# Patient Record
Sex: Female | Born: 1985 | Race: Black or African American | Hispanic: No | Marital: Single | Smoking: Current every day smoker
Health system: Southern US, Community
[De-identification: ages and names within clinical notes are randomized; demographics above are authoritative.]

## PROBLEM LIST (undated history)

## (undated) DIAGNOSIS — L509 Urticaria, unspecified: Secondary | ICD-10-CM

## (undated) DIAGNOSIS — G43909 Migraine, unspecified, not intractable, without status migrainosus: Secondary | ICD-10-CM

## (undated) HISTORY — PX: DENTAL SURGERY: SHX609

## (undated) HISTORY — DX: Urticaria, unspecified: L50.9

---
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
## 2003-09-19 ENCOUNTER — Emergency Department (HOSPITAL_COMMUNITY): Admission: EM | Admit: 2003-09-19 | Discharge: 2003-09-19 | Payer: Self-pay | Admitting: Emergency Medicine

## 2004-06-25 ENCOUNTER — Emergency Department (HOSPITAL_COMMUNITY): Admission: EM | Admit: 2004-06-25 | Discharge: 2004-06-26 | Payer: Self-pay | Admitting: *Deleted

## 2005-06-03 ENCOUNTER — Emergency Department (HOSPITAL_COMMUNITY): Admission: EM | Admit: 2005-06-03 | Discharge: 2005-06-03 | Payer: Self-pay | Admitting: Emergency Medicine

## 2005-06-05 ENCOUNTER — Inpatient Hospital Stay (HOSPITAL_COMMUNITY): Admission: AD | Admit: 2005-06-05 | Discharge: 2005-06-05 | Payer: Self-pay | Admitting: *Deleted

## 2005-06-08 ENCOUNTER — Inpatient Hospital Stay (HOSPITAL_COMMUNITY): Admission: AD | Admit: 2005-06-08 | Discharge: 2005-06-08 | Payer: Self-pay | Admitting: Obstetrics & Gynecology

## 2005-06-16 ENCOUNTER — Inpatient Hospital Stay (HOSPITAL_COMMUNITY): Admission: AD | Admit: 2005-06-16 | Discharge: 2005-06-16 | Payer: Self-pay | Admitting: *Deleted

## 2005-12-01 ENCOUNTER — Emergency Department (HOSPITAL_COMMUNITY): Admission: EM | Admit: 2005-12-01 | Discharge: 2005-12-02 | Payer: Self-pay | Admitting: Emergency Medicine

## 2006-02-05 ENCOUNTER — Inpatient Hospital Stay (HOSPITAL_COMMUNITY): Admission: AD | Admit: 2006-02-05 | Discharge: 2006-02-05 | Payer: Self-pay | Admitting: Gynecology

## 2006-02-06 ENCOUNTER — Inpatient Hospital Stay (HOSPITAL_COMMUNITY): Admission: AD | Admit: 2006-02-06 | Discharge: 2006-02-06 | Payer: Self-pay | Admitting: Gynecology

## 2006-08-09 ENCOUNTER — Emergency Department (HOSPITAL_COMMUNITY): Admission: EM | Admit: 2006-08-09 | Discharge: 2006-08-09 | Payer: Self-pay | Admitting: *Deleted

## 2006-08-17 ENCOUNTER — Emergency Department (HOSPITAL_COMMUNITY): Admission: EM | Admit: 2006-08-17 | Discharge: 2006-08-18 | Payer: Self-pay | Admitting: Emergency Medicine

## 2006-10-05 ENCOUNTER — Ambulatory Visit (HOSPITAL_COMMUNITY): Admission: RE | Admit: 2006-10-05 | Discharge: 2006-10-05 | Payer: Self-pay | Admitting: Family Medicine

## 2006-10-13 ENCOUNTER — Ambulatory Visit (HOSPITAL_COMMUNITY): Admission: RE | Admit: 2006-10-13 | Discharge: 2006-10-13 | Payer: Self-pay | Admitting: Family Medicine

## 2006-11-03 ENCOUNTER — Ambulatory Visit (HOSPITAL_COMMUNITY): Admission: RE | Admit: 2006-11-03 | Discharge: 2006-11-03 | Payer: Self-pay | Admitting: Family Medicine

## 2006-11-10 ENCOUNTER — Inpatient Hospital Stay (HOSPITAL_COMMUNITY): Admission: AD | Admit: 2006-11-10 | Discharge: 2006-11-10 | Payer: Self-pay | Admitting: Obstetrics & Gynecology

## 2006-11-17 ENCOUNTER — Ambulatory Visit (HOSPITAL_COMMUNITY): Admission: RE | Admit: 2006-11-17 | Discharge: 2006-11-17 | Payer: Self-pay | Admitting: Family Medicine

## 2007-03-21 ENCOUNTER — Inpatient Hospital Stay (HOSPITAL_COMMUNITY): Admission: AD | Admit: 2007-03-21 | Discharge: 2007-03-21 | Payer: Self-pay | Admitting: Obstetrics & Gynecology

## 2007-03-21 ENCOUNTER — Ambulatory Visit: Payer: Self-pay | Admitting: Obstetrics & Gynecology

## 2007-04-18 ENCOUNTER — Ambulatory Visit: Payer: Self-pay | Admitting: Obstetrics and Gynecology

## 2007-04-18 ENCOUNTER — Inpatient Hospital Stay (HOSPITAL_COMMUNITY): Admission: AD | Admit: 2007-04-18 | Discharge: 2007-04-21 | Payer: Self-pay | Admitting: Obstetrics and Gynecology

## 2008-03-10 IMAGING — US US OB DETAIL+14 WK
1 series · 14 of 28 positions shown · non-contrast
Comparison: none

OBSTETRICAL ULTRASOUND:
 This ultrasound was performed in The [HOSPITAL], and the [facility] report will be stored to [REDACTED] PACS.

[Series 1: us ob detail+14 wk · 14 of 67 slices shown]
[im 3/67]
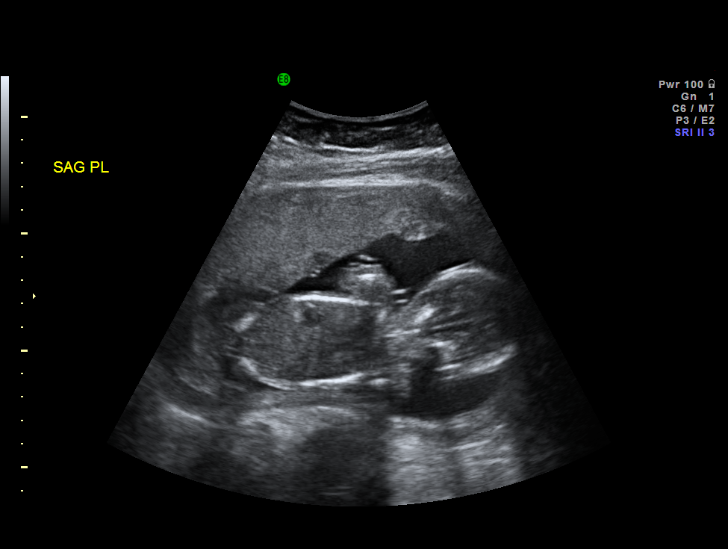
[im 8/67]
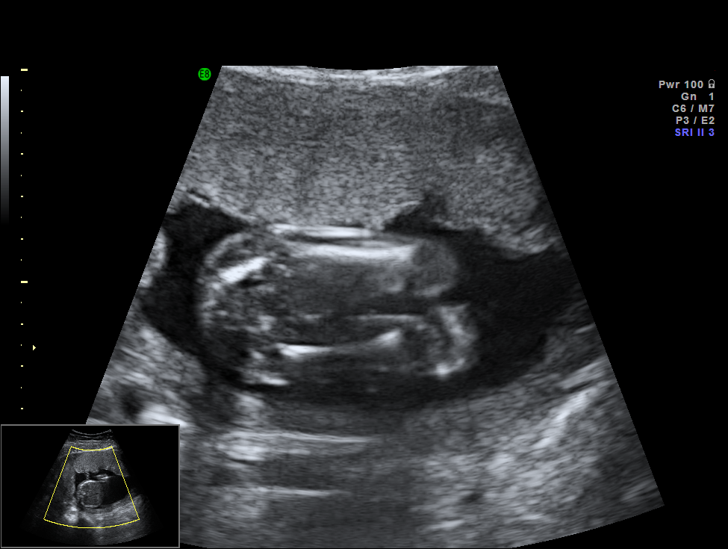
[im 13/67]
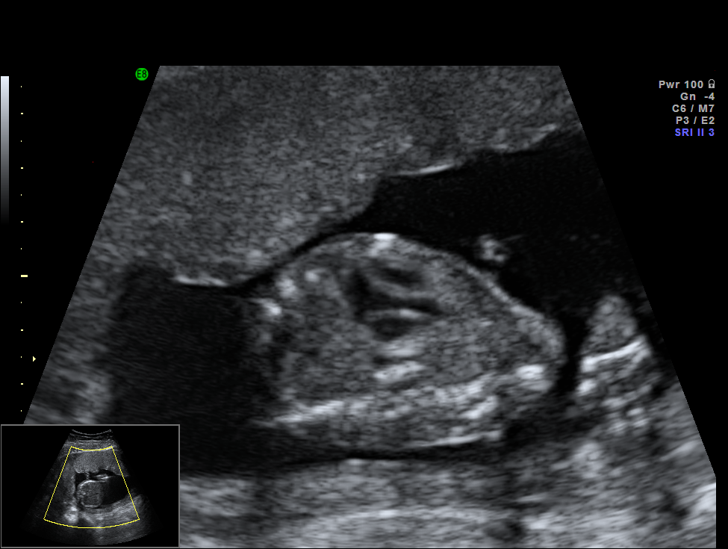
[im 18/67]
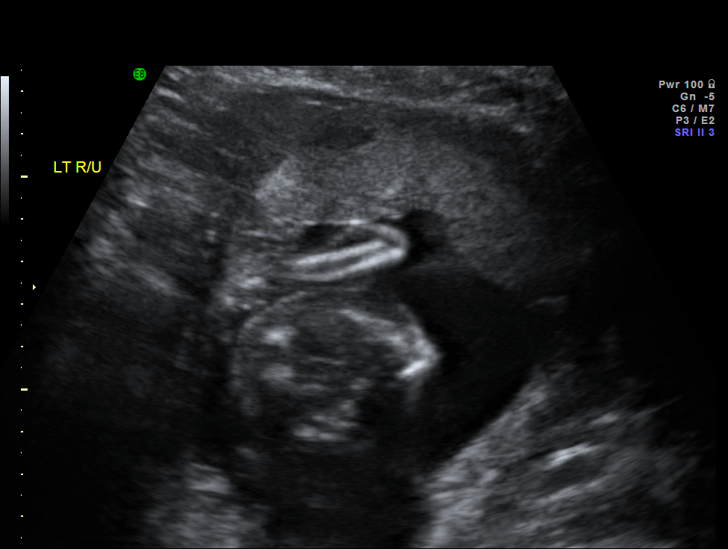
[im 23/67]
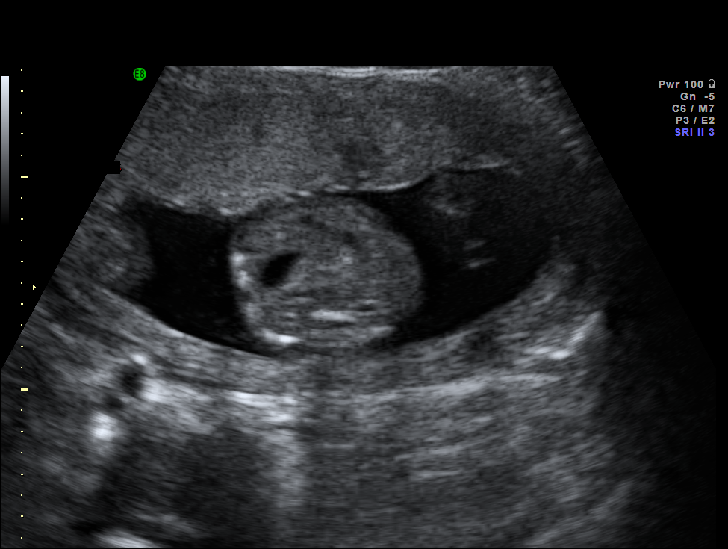
[im 27/67]
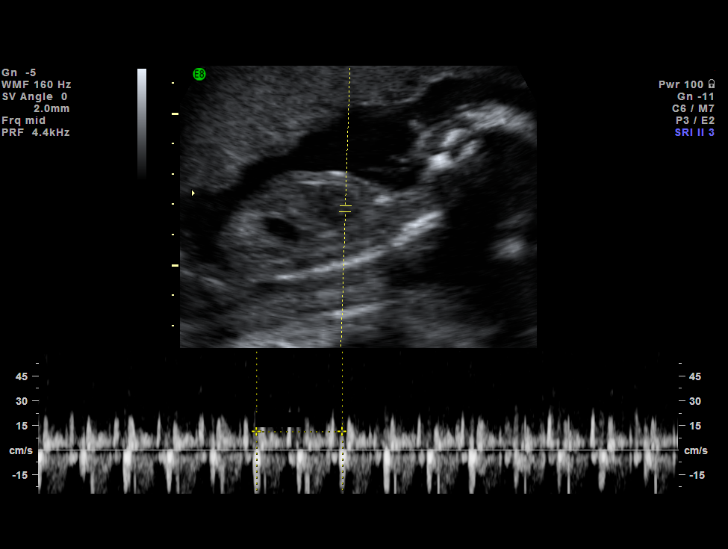
[im 32/67]
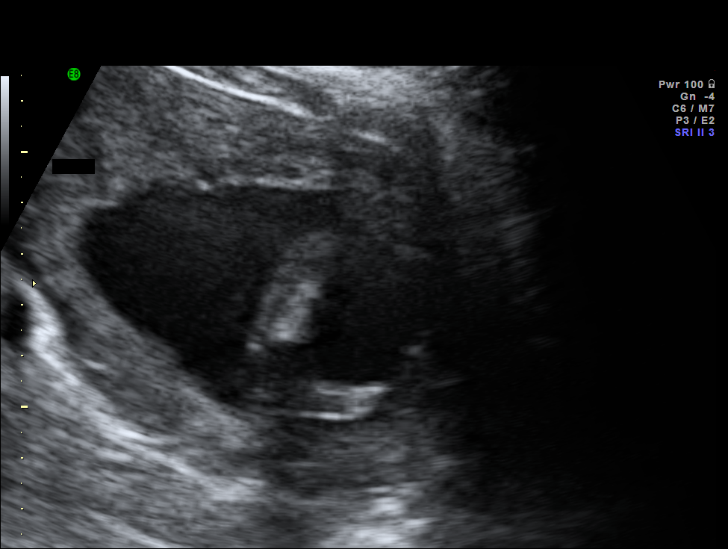
[im 37/67]
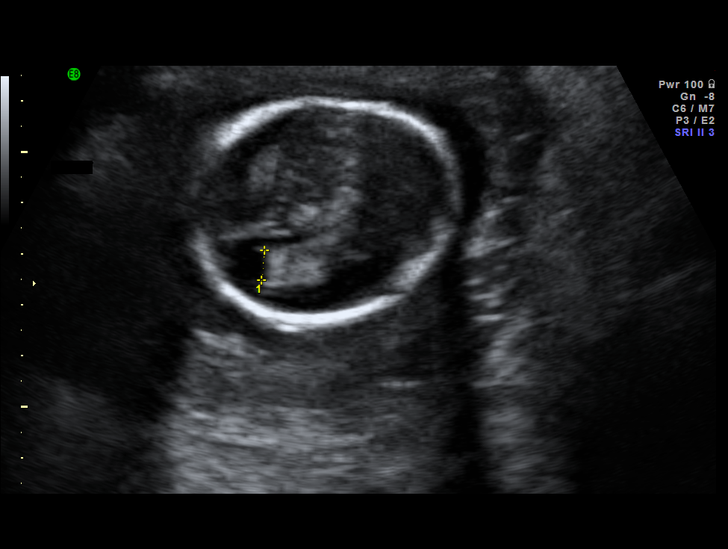
[im 42/67]
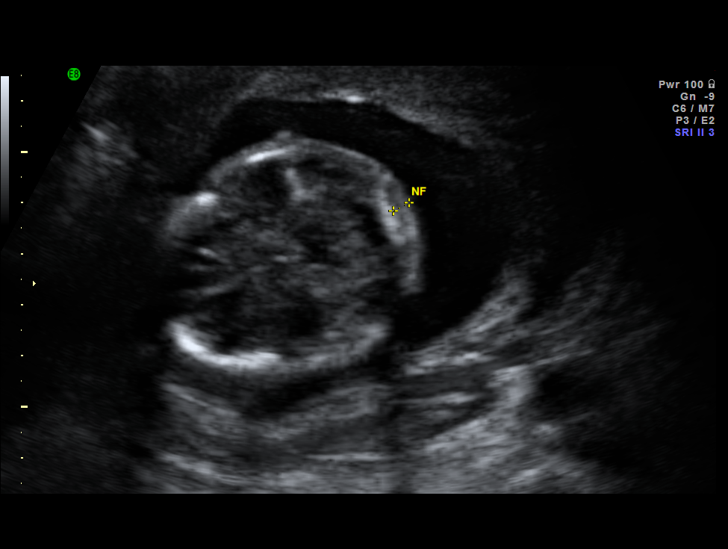
[im 47/67]
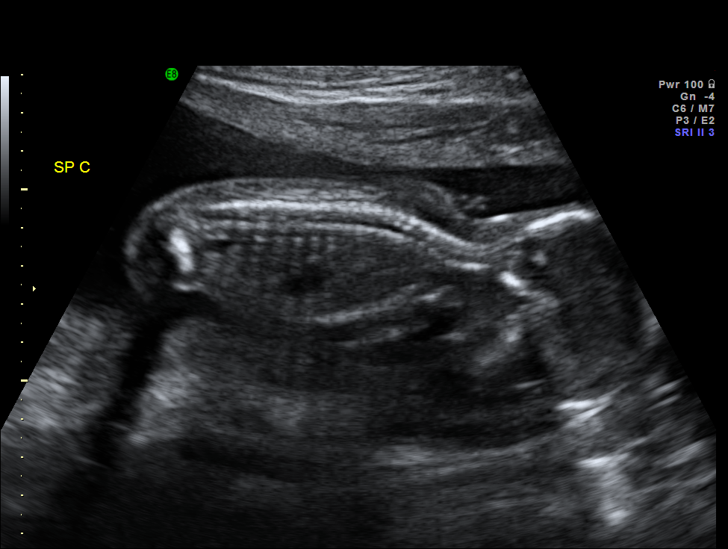
[im 52/67]
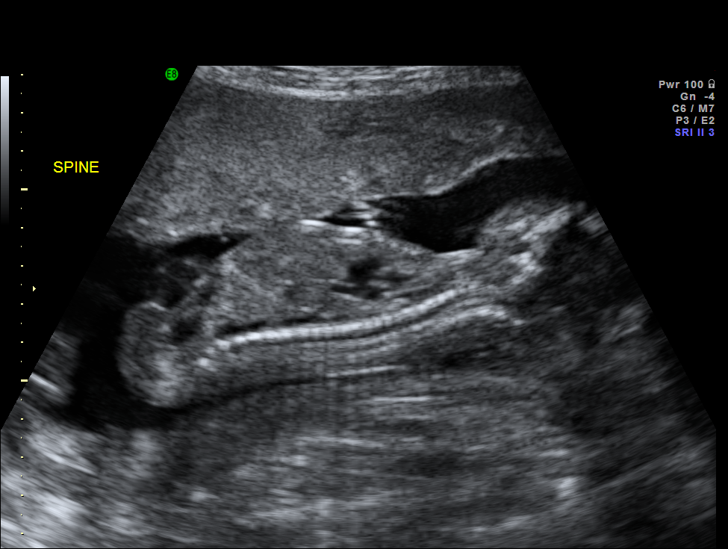
[im 57/67]
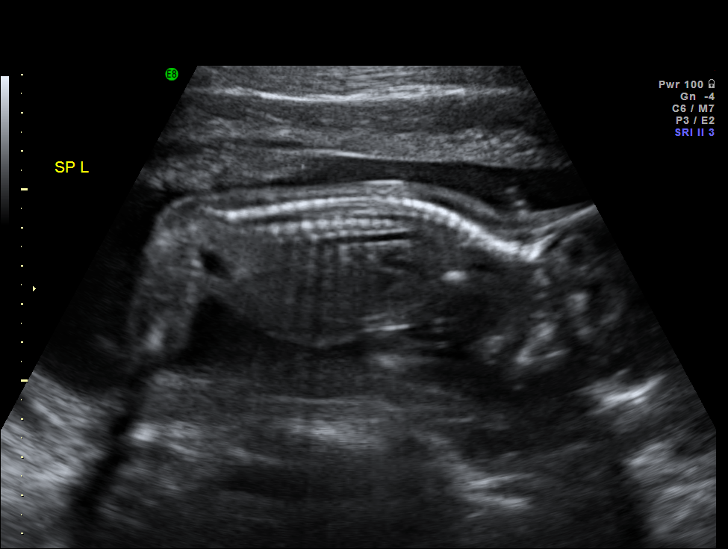
[im 62/67]
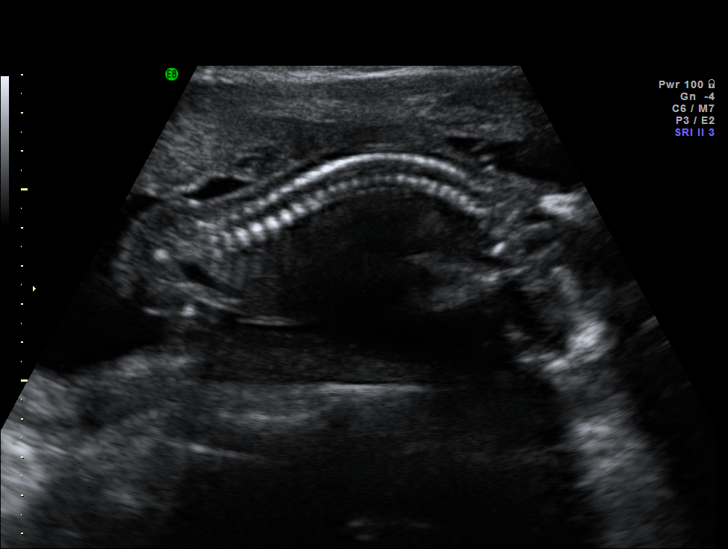
[im 67/67]
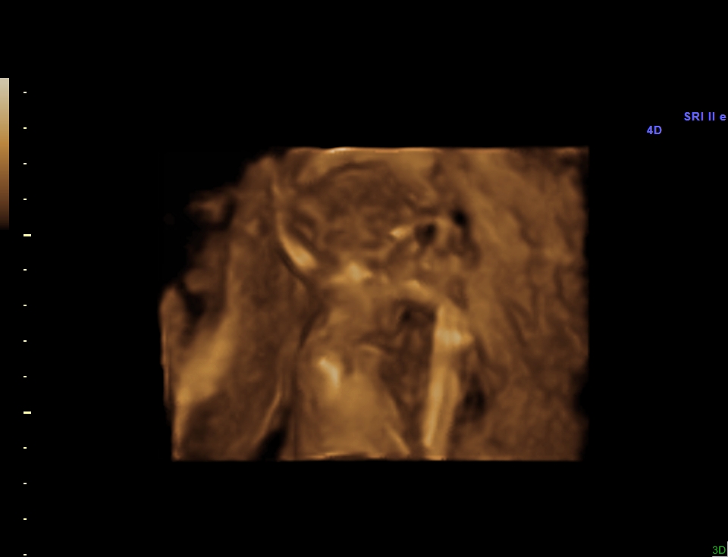

[14 of 28 positions shown; findings below may reference images not displayed]

IMPRESSION: The [facility] report has also been faxed to the ordering physician.

## 2010-03-15 ENCOUNTER — Emergency Department (HOSPITAL_COMMUNITY)
Admission: EM | Admit: 2010-03-15 | Discharge: 2010-03-15 | Payer: Self-pay | Source: Home / Self Care | Admitting: Emergency Medicine

## 2010-09-30 NOTE — Op Note (Signed)
NAME:  [name], Jasmine Cunningham            ACCOUNT NO.:  [id number]   MEDICAL RECORD NO.:  [id number]          PATIENT TYPE:  INP   LOCATION:  9115                          FACILITY:  [facility]   PHYSICIAN:  Phil D. Okey Dupre, M.D.     DATE OF BIRTH:  09-18-1985   DATE OF PROCEDURE:  04/19/2007  DATE OF DISCHARGE:                               OPERATIVE REPORT   DELIVERY NOTE:   PROCEDURE:  Vacuum-assisted vaginal delivery.   OBSTETRICIAN/SURGEON:  Javier Glazier. Okey Dupre, M.D.   ASSISTANT:  Zerita Boers, N.M.   ESTIMATED BLOOD LOSS:  600 mL   ANESTHESIA:  Epidural plus local.   PREOPERATIVE DIAGNOSES:  1. Term pregnancy.  2. Full dilatation with vertex at plus 2 station.  3. Maternal exhaustion.   POSTOPERATIVE DIAGNOSES:  1. Term pregnancy.  2. Full dilatation with vertex at plus 2 station.  3. Maternal exhaustion.   PROCEDURE WENT AS FOLLOWS:  Under satisfactory epidural anesthesia, the  patient having had an extended second stage of labor and was completely  exhausted with 2 plus caput and the vertex in an LOA presentation at a  plus 2 station with adequate room in the pelvis, it was decided to apply  the vacuum for assisted delivery.  The perineum was injected with 20 mL  more of 1% Xylocaine for further relaxation and comfort and the vacuum-  assisted extractor was placed over the occiput.  When the patient  pushed, the vacuum was pulled during three contractions for a total of 3  pulls.  It did pop off twice during that time and by this time, the  vertex was right at the introitus.  The patient was able to push out the  head with fundal pressure over a midline episiotomy.  Once the head was  out, the baby was easily delivered, cord clamped and divided and the  baby taken to the isolette.  Samples of blood were taken from the cord  for analysis.  The placenta was spontaneously delivered.  The vagina was  explored  for lacerations.  None were noted.  The episiotomy was closed in the  usual  manner with 2-0 chromic catgut suture.  The estimated blood loss  was 600 mL.   POSTOPERATIVE CONDITION:  Satisfactory.      Phil D. Okey Dupre, M.D.  Electronically Signed     PDR/MEDQ  D:  04/19/2007  T:  04/19/2007  Job:  [id number]

## 2011-02-23 LAB — CBC
HCT: 23 — ABNORMAL LOW
HCT: 29 — ABNORMAL LOW
Hemoglobin: 10.2 — ABNORMAL LOW
Hemoglobin: 8 — ABNORMAL LOW
MCHC: 33.7
MCHC: 35.1
MCV: 88.9
MCV: 89.6
Platelets: 169
Platelets: 210
RBC: 2.52 — ABNORMAL LOW
RBC: 3.23 — ABNORMAL LOW
RDW: 17.6 — ABNORMAL HIGH
WBC: 7.2
WBC: 8
WBC: 8.3

## 2011-02-23 LAB — COMPREHENSIVE METABOLIC PANEL
ALT: 16
Alkaline Phosphatase: 220 — ABNORMAL HIGH
BUN: 2 — ABNORMAL LOW
CO2: 19
Calcium: 8.6
GFR calc non Af Amer: >60
Glucose, Bld: 80
Sodium: 135
Total Protein: 5.9 — ABNORMAL LOW

## 2011-02-23 LAB — LACTATE DEHYDROGENASE: LDH: 195

## 2011-02-23 LAB — RPR: RPR Ser Ql: NONREACTIVE

## 2011-02-24 LAB — URINALYSIS, ROUTINE W REFLEX MICROSCOPIC
Glucose, UA: NEGATIVE
Hgb urine dipstick: NEGATIVE
Specific Gravity, Urine: >1.03 — ABNORMAL HIGH
pH: 6

## 2011-03-04 LAB — WET PREP, GENITAL
Clue Cells Wet Prep HPF POC: NONE SEEN
Trich, Wet Prep: NONE SEEN
Yeast Wet Prep HPF POC: NONE SEEN

## 2011-03-04 LAB — CBC
HCT: 31.6 — ABNORMAL LOW
Platelets: 221
RDW: 14.7 — ABNORMAL HIGH

## 2011-03-04 LAB — URINALYSIS, ROUTINE W REFLEX MICROSCOPIC
Glucose, UA: NEGATIVE
Protein, ur: NEGATIVE
Specific Gravity, Urine: 1.025
pH: 6

## 2012-06-20 ENCOUNTER — Other Ambulatory Visit: Payer: Self-pay

## 2012-06-20 ENCOUNTER — Encounter (HOSPITAL_COMMUNITY): Payer: Self-pay | Admitting: Emergency Medicine

## 2012-06-20 ENCOUNTER — Emergency Department (HOSPITAL_COMMUNITY)
Admission: EM | Admit: 2012-06-20 | Discharge: 2012-06-20 | Disposition: A | Discharge status: Home / Self Care | Payer: Self-pay | Attending: Emergency Medicine | Admitting: Emergency Medicine

## 2012-06-20 DIAGNOSIS — R42 Dizziness and giddiness: Secondary | ICD-10-CM | POA: Insufficient documentation

## 2012-06-20 DIAGNOSIS — I951 Orthostatic hypotension: Secondary | ICD-10-CM

## 2012-06-20 LAB — CBC WITH DIFFERENTIAL/PLATELET
Basophils Absolute: 0 10*3/uL (ref 0.0–0.1)
Basophils Relative: 0 % (ref 0–1)
Hemoglobin: 10.5 g/dL — ABNORMAL LOW (ref 12.0–15.0)
MCHC: 32.7 g/dL (ref 30.0–36.0)
Monocytes Relative: 8 % (ref 3–12)
Neutro Abs: 2.3 10*3/uL (ref 1.7–7.7)
Neutrophils Relative %: 62 % (ref 43–77)

## 2012-06-20 LAB — BASIC METABOLIC PANEL
Chloride: 104 mEq/L (ref 96–112)
GFR calc Af Amer: >90 mL/min (ref >90)
Potassium: 3.8 mEq/L (ref 3.5–5.1)

## 2012-06-20 MED ORDER — SODIUM CHLORIDE 0.9 % IV SOLN
1000.0000 mL | INTRAVENOUS | Status: DC
  Administered 2012-06-20: 1000 mL via INTRAVENOUS

## 2012-06-20 MED ORDER — SODIUM CHLORIDE 0.9 % IV SOLN
1000.0000 mL | Freq: Once | INTRAVENOUS | Status: AC
  Administered 2012-06-20: 1000 mL via INTRAVENOUS

## 2012-06-20 NOTE — ED Notes (Signed)
Pt c/o syncope and feeling lightheaded x months and with syncopal episode on Saturday where fell and hit head; pt sts dizziness and feeling flushed prior to events; pt denies CP or SOB

## 2012-06-20 NOTE — ED Provider Notes (Signed)
History     CSN: [id number]  Arrival date & time 06/20/12  1043   First MD Initiated Contact with Patient 06/20/12 1113      Chief Complaint  Patient presents with  . Loss of Consciousness     The history is provided by the patient.   patient reports several months of ongoing lightheadedness when she stands up or is on her feet for too long.  She denies heavy menstrual periods.  She denies melena and hematochezia.  She does not have a primary care physician and she's never been seen or evaluated for this complaint.  She finally presents the emergency department today because 2 days ago she stood up became lightheaded Passed out.  She denied receiving chest pain or palpitations.  She has no chest pain or chest tightness at this time.  No prior history of cardiac disease.  She continues to use marijuana but uses no other drugs.  She does not smoke cigarettes.  No family history of early heart disease.  She does report polyuria and polydipsia for several months.  She does not know she's a diabetic.  No fevers or chills.  No other complaints.  History reviewed. No pertinent past medical history.  History reviewed. No pertinent past surgical history.  History reviewed. No pertinent family history.  History  Substance Use Topics  . Smoking status: Never Smoker   . Smokeless tobacco: Not on file  . Alcohol Use: Yes    OB History    Grav Para Term Preterm Abortions TAB SAB Ect Mult Living                  Review of Systems  All other systems reviewed and are negative.    Allergies  Review of patient's allergies indicates no known allergies.  Home Medications  No current outpatient prescriptions on file.  BP 136/87  Pulse 97  Temp 98.6 F (37 C) (Oral)  Resp 18  SpO2 99%  Physical Exam  Nursing note and vitals reviewed. Constitutional: She is oriented to person, place, and time. She appears well-developed and well-nourished. No distress.  HENT:  Head: Normocephalic  and atraumatic.  Eyes: EOM are normal.  Neck: Normal range of motion.  Cardiovascular: Normal rate, regular rhythm and normal heart sounds.   Pulmonary/Chest: Effort normal and breath sounds normal.  Abdominal: Soft. She exhibits no distension. There is no tenderness.  Musculoskeletal: Normal range of motion.  Neurological: She is alert and oriented to person, place, and time.  Skin: Skin is warm and dry.  Psychiatric: She has a normal mood and affect. Judgment normal.    ED Course  Procedures (including critical care time)  Labs Reviewed  CBC WITH DIFFERENTIAL - Abnormal; Notable for the following:    WBC 3.7 (*)     RBC 3.59 (*)     Hemoglobin 10.5 (*)     HCT 32.1 (*)     All other components within normal limits  BASIC METABOLIC PANEL  HCG, SERUM, QUALITATIVE  GLUCOSE, CAPILLARY   No results found.   Date: 06/20/2012  Rate: 90  Rhythm: normal sinus rhythm  QRS Axis: normal  Intervals: normal  ST/T Wave abnormalities: inferior lateral T wave inversions  Conduction Disutrbances: none  Narrative Interpretation:   Old EKG Reviewed: no prior ecg     1. Orthostasis       MDM  Patient feels somewhat better after IV fluids.  EKG normal sinus rhythm.  Outpatient followup with her primary  care physician.  Anemia is baseline for patient.        Lyanne Co, MD 06/20/12 1415

## 2012-06-20 NOTE — ED Notes (Signed)
Pt's CBG is 84. RN notified.

## 2012-06-20 NOTE — ED Notes (Signed)
Pt states she is still feeling dizzy, not feeling any better.

## 2012-06-20 NOTE — ED Notes (Signed)
Pt ambulated to bathroom with quick, steady gait

## 2012-06-20 NOTE — ED Notes (Signed)
Pt c/o feeling dizzy "for months." Pt states "I blacked all the way out on Saturday and woke up in a parking lot." NAd noted presently. Pt states right side of body is painful from fall. Pt states she did hit her head. Today pt c/o dizziness and 6/10 pain in body. Pt states she is also having headaches for 2 wks. Neuro intact, no deficits noted,

## 2014-10-01 ENCOUNTER — Encounter (HOSPITAL_COMMUNITY): Payer: Self-pay | Admitting: *Deleted

## 2014-10-01 ENCOUNTER — Emergency Department (HOSPITAL_COMMUNITY)
Admission: EM | Admit: 2014-10-01 | Discharge: 2014-10-01 | Disposition: A | Discharge status: Home / Self Care | Payer: Self-pay | Attending: Emergency Medicine | Admitting: Emergency Medicine

## 2014-10-01 DIAGNOSIS — Z720 Tobacco use: Secondary | ICD-10-CM | POA: Insufficient documentation

## 2014-10-01 DIAGNOSIS — G43909 Migraine, unspecified, not intractable, without status migrainosus: Secondary | ICD-10-CM | POA: Insufficient documentation

## 2014-10-01 MED ORDER — TIZANIDINE HCL 4 MG PO CAPS: 4.0000 mg | ORAL_CAPSULE | Freq: Three times a day (TID) | ORAL | Status: DC

## 2014-10-01 MED ORDER — PROCHLORPERAZINE MALEATE 10 MG PO TABS
10.0000 mg | ORAL_TABLET | Freq: Once | ORAL | Status: AC
  Administered 2014-10-01: 10 mg via ORAL
  Filled 2014-10-01: qty 1

## 2014-10-01 MED ORDER — TIZANIDINE HCL 4 MG PO TABS
4.0000 mg | ORAL_TABLET | Freq: Once | ORAL | Status: AC
  Administered 2014-10-01: 4 mg via ORAL
  Filled 2014-10-01: qty 1

## 2014-10-01 MED ORDER — DIPHENHYDRAMINE HCL 25 MG PO CAPS
25.0000 mg | ORAL_CAPSULE | Freq: Once | ORAL | Status: AC
  Administered 2014-10-01: 25 mg via ORAL
  Filled 2014-10-01: qty 1

## 2014-10-01 NOTE — ED Notes (Signed)
Pt c/o migraine x 4 days with blurry vision. Pt states that she has taken alleve with no relief.

## 2014-10-01 NOTE — Discharge Instructions (Signed)
Please follow up with your primary care physician in 1-2 days. If you do not have one please call the [facility] number listed above. Please read all discharge instructions and return precautions.  ° °Migraine Headache °A migraine headache is an intense, throbbing pain on one or both sides of your head. A migraine can last for 30 minutes to several hours. °CAUSES  °The exact cause of a migraine headache is not always known. However, a migraine may be caused when nerves in the brain become irritated and release chemicals that cause inflammation. This causes pain. °Certain things may also trigger migraines, such as: °· Alcohol. °· Smoking. °· Stress. °· Menstruation. °· Aged cheeses. °· Foods or drinks that contain nitrates, glutamate, aspartame, or tyramine. °· Lack of sleep. °· Chocolate. °· Caffeine. °· Hunger. °· Physical exertion. °· Fatigue. °· Medicines used to treat chest pain (nitroglycerine), birth control pills, estrogen, and some blood pressure medicines. °SIGNS AND SYMPTOMS °· Pain on one or both sides of your head. °· Pulsating or throbbing pain. °· Severe pain that prevents daily activities. °· Pain that is aggravated by any physical activity. °· Nausea, vomiting, or both. °· Dizziness. °· Pain with exposure to bright lights, loud noises, or activity. °· General sensitivity to bright lights, loud noises, or smells. °Before you get a migraine, you may get warning signs that a migraine is coming (aura). An aura may include: °· Seeing flashing lights. °· Seeing bright spots, halos, or zigzag lines. °· Having tunnel vision or blurred vision. °· Having feelings of numbness or tingling. °· Having trouble talking. °· Having muscle weakness. °DIAGNOSIS  °A migraine headache is often diagnosed based on: °· Symptoms. °· Physical exam. °· A CT scan or MRI of your head. These imaging tests cannot diagnose migraines, but they can help rule out other causes of headaches. °TREATMENT °Medicines may  be given for pain and nausea. Medicines can also be given to help prevent recurrent migraines.  °HOME CARE INSTRUCTIONS °· Only take over-the-counter or prescription medicines for pain or discomfort as directed by your health care provider. The use of long-term narcotics is not recommended. °· Lie down in a dark, quiet room when you have a migraine. °· Keep a journal to find out what may trigger your migraine headaches. For example, write down: °¨ What you eat and drink. °¨ How much sleep you get. °¨ Any change to your diet or medicines. °· Limit alcohol consumption. °· Quit smoking if you smoke. °· Get 7-9 hours of sleep, or as recommended by your health care provider. °· Limit stress. °· Keep lights dim if bright lights bother you and make your migraines worse. °SEEK IMMEDIATE MEDICAL CARE IF:  °· Your migraine becomes severe. °· You have a fever. °· You have a stiff neck. °· You have vision loss. °· You have muscular weakness or loss of muscle control. °· You start losing your balance or have trouble walking. °· You feel faint or pass out. °· You have severe symptoms that are different from your first symptoms. °MAKE SURE YOU:  °· Understand these instructions. °· Will watch your condition. °· Will get help right away if you are not doing well or get worse. °Document Released: 05/04/2005 Document Revised: 09/18/2013 Document Reviewed: 01/09/2013 °[employer]® Patient Information ©2015 [employer]. This information is not intended to replace advice given to you by your health care provider. Make sure you discuss any questions you have with your health care provider. ° °

## 2014-10-01 NOTE — ED Provider Notes (Signed)
CSN: [id number]     Arrival date & time 10/01/14  1618 History  This chart was scribed for non-physician practitioner, Francee PiccoloJennifer Laterrica Libman working with Raeford RazorStephen Kohut, MD by Richarda Overlieichard Holland, ED Scribe. This patient was seen in room TR09C/TR09C and the patient's care was started at 6:25 PM.  Chief Complaint  Patient presents with  . Headache   The history is provided by the patient. No language interpreter was used.   HPI Comments: Jasmine Cunningham is a 29 y.o. female who presents to the Emergency Department complaining of gradually worsening headache for the last 4 days. Pt reports associated visual blurriness and photophobia. She states that her HA is located on the back side of her head and describes her pain as throbbing. Pt states that she has tried taking aleve without relief. She reports that her last migraine was approximately 3 months ago. Pt denies any recent tick bites. Pt denies vomiting, diarrhea, fever or rash.    History reviewed. No pertinent past medical history. History reviewed. No pertinent past surgical history. No family history on file. History  Substance Use Topics  . Smoking status: Current Every Day Smoker -- 0.50 packs/day    Types: Cigarettes  . Smokeless tobacco: Not on file  . Alcohol Use: Yes     Comment: ocassional   OB History    No data available     Review of Systems  Constitutional: Negative for fever.  Eyes: Positive for visual disturbance.  Gastrointestinal: Negative for vomiting.  Skin: Negative for rash.  Neurological: Positive for headaches.  All other systems reviewed and are negative.  Allergies  Review of patient's allergies indicates no known allergies.  Home Medications   Prior to Admission medications   Medication Sig Start Date End Date Taking? Authorizing Provider  tiZANidine (ZANAFLEX) 4 MG capsule Take 1 capsule (4 mg total) by mouth 3 (three) times daily. 10/01/14   Jordyan Hardiman, PA-C   BP 126/75 mmHg  Pulse 65   Temp(Src) 98 F (36.7 C) (Oral)  Resp 18  Ht 5\' 8"  (1.727 m)  Wt 220 lb 8 oz (100.018 kg)  BMI 33.53 kg/m2  SpO2 100%  LMP 09/17/2014   Physical Exam  Constitutional: She is oriented to person, place, and time. She appears well-developed and well-nourished. No distress.  HENT:  Head: Normocephalic and atraumatic.  Right Ear: External ear normal.  Left Ear: External ear normal.  Nose: Nose normal.  Mouth/Throat: Oropharynx is clear and moist.  Eyes: Conjunctivae are normal.  Neck: Normal range of motion. Neck supple.  No nuchal rigidity.   Cardiovascular: Normal rate.   Pulmonary/Chest: Effort normal.  Abdominal: Soft.  Musculoskeletal: Normal range of motion.  Neurological: She is alert and oriented to person, place, and time.  Skin: Skin is warm and dry. She is not diaphoretic.  Psychiatric: She has a normal mood and affect.  Nursing note and vitals reviewed.   ED Course  Procedures   Medications  tiZANidine (ZANAFLEX) tablet 4 mg (4 mg Oral Given 10/01/14 1850)  diphenhydrAMINE (BENADRYL) capsule 25 mg (25 mg Oral Given 10/01/14 1850)  prochlorperazine (COMPAZINE) tablet 10 mg (10 mg Oral Given 10/01/14 1850)    DIAGNOSTIC STUDIES: Oxygen Saturation is 97% on RA, normal by my interpretation.    COORDINATION OF CARE: 6:30 PM Discussed treatment plan with pt at bedside and pt agreed to plan.   Labs Review Labs Reviewed - No data to display  Imaging Review No results found.   EKG Interpretation None  MDM   Final diagnoses:  Migraine without status migrainosus, not intractable, unspecified migraine type   Filed Vitals:   10/01/14 1856  BP: 126/75  Pulse: 65  Temp: 98 F (36.7 C)  Resp: 18   Afebrile, NAD, non-toxic appearing, AAOx4.  Pt HA treated and improved while in ED.  Presentation is like pts typical HA and non concerning for [facility], ICH, Meningitis, or temporal arteritis. Pt is afebrile with no focal neuro deficits, nuchal rigidity, or change  in vision. Pt is to follow up with PCP to discuss prophylactic medication. Pt verbalizes understanding and is agreeable with plan to dc. Patient is stable at time of discharge    I personally performed the services described in this documentation, which was scribed in my presence. The recorded information has been reviewed and is accurate.       Francee PiccoloJennifer Yolando Gillum, PA-C 10/01/14 2116  Raeford RazorStephen Kohut, MD 10/03/14 [phone]

## 2015-01-28 ENCOUNTER — Emergency Department (HOSPITAL_COMMUNITY)
Admission: EM | Admit: 2015-01-28 | Discharge: 2015-01-28 | Disposition: A | Discharge status: Home / Self Care | Payer: Self-pay | Attending: Emergency Medicine | Admitting: Emergency Medicine

## 2015-01-28 ENCOUNTER — Encounter (HOSPITAL_COMMUNITY): Payer: Self-pay

## 2015-01-28 DIAGNOSIS — G44009 Cluster headache syndrome, unspecified, not intractable: Secondary | ICD-10-CM | POA: Insufficient documentation

## 2015-01-28 DIAGNOSIS — G4489 Other headache syndrome: Secondary | ICD-10-CM

## 2015-01-28 DIAGNOSIS — Z720 Tobacco use: Secondary | ICD-10-CM | POA: Insufficient documentation

## 2015-01-28 DIAGNOSIS — Z79899 Other long term (current) drug therapy: Secondary | ICD-10-CM | POA: Insufficient documentation

## 2015-01-28 DIAGNOSIS — Z8679 Personal history of other diseases of the circulatory system: Secondary | ICD-10-CM | POA: Insufficient documentation

## 2015-01-28 HISTORY — DX: Migraine, unspecified, not intractable, without status migrainosus: G43.909

## 2015-01-28 MED ORDER — DEXAMETHASONE SODIUM PHOSPHATE 10 MG/ML IJ SOLN
10.0000 mg | Freq: Once | INTRAMUSCULAR | Status: AC
  Administered 2015-01-28: 10 mg via INTRAMUSCULAR
  Filled 2015-01-28: qty 1

## 2015-01-28 MED ORDER — BUTALBITAL-APAP-CAFFEINE 50-325-40 MG PO TABS: 1.0000 | ORAL_TABLET | Freq: Four times a day (QID) | ORAL | Status: DC | PRN

## 2015-01-28 MED ORDER — DIPHENHYDRAMINE HCL 50 MG/ML IJ SOLN
25.0000 mg | Freq: Once | INTRAMUSCULAR | Status: AC
  Administered 2015-01-28: 25 mg via INTRAMUSCULAR
  Filled 2015-01-28: qty 1

## 2015-01-28 MED ORDER — METOCLOPRAMIDE HCL 5 MG/ML IJ SOLN
10.0000 mg | Freq: Once | INTRAMUSCULAR | Status: AC
  Administered 2015-01-28: 10 mg via INTRAMUSCULAR
  Filled 2015-01-28: qty 2

## 2015-01-28 MED ORDER — KETOROLAC TROMETHAMINE 60 MG/2ML IM SOLN
60.0000 mg | Freq: Once | INTRAMUSCULAR | Status: AC
  Administered 2015-01-28: 60 mg via INTRAMUSCULAR
  Filled 2015-01-28: qty 2

## 2015-01-28 NOTE — ED Provider Notes (Signed)
CSN: [id number]     Arrival date & time 01/28/15  1248 History   First MD Initiated Contact with Patient 01/28/15 1705     Chief Complaint  Patient presents with  . Migraine     (Consider location/radiation/quality/duration/timing/severity/associated sxs/prior Treatment) HPI Comments: Patient here complaining of bitemporal migraine headache times several months. Symptoms are worse after she uses her computer screen at work. She has nausea but no vomiting. She does have photophobia and also phonophobia. Denies any neck pain. Denies any flashes of lights in her vision. Symptoms are better when she sleeps. Has tried over-the-counter medications without relief. No associated syncope or near-syncope. No focal neurological findings.  Patient is a 29 y.o. female presenting with migraines. The history is provided by the patient and a relative.  Migraine    Past Medical History  Diagnosis Date  . Migraine    Past Surgical History  Procedure Laterality Date  . Dental surgery     Family History  Problem Relation Age of Onset  . Hypertension Father    Social History  Substance Use Topics  . Smoking status: Current Every Day Smoker -- 0.50 packs/day    Types: Cigarettes  . Smokeless tobacco: Never Used  . Alcohol Use: Yes     Comment: ocassional   OB History    No data available     Review of Systems  All other systems reviewed and are negative.     Allergies  Review of patient's allergies indicates no known allergies.  Home Medications   Prior to Admission medications   Medication Sig Start Date End Date Taking? Authorizing Provider  tiZANidine (ZANAFLEX) 4 MG capsule Take 1 capsule (4 mg total) by mouth 3 (three) times daily. 10/01/14   Jennifer Piepenbrink, PA-C   BP 162/99 mmHg  Pulse 86  Temp(Src) 98.3 F (36.8 C) (Oral)  Resp 16  Ht 5\' 8"  (1.727 m)  Wt 220 lb (99.791 kg)  BMI 33.46 kg/m2  SpO2 100%  LMP 01/07/2015 Physical Exam  Constitutional: She is  oriented to person, place, and time. She appears well-developed and well-nourished.  Non-toxic appearance. No distress.  HENT:  Head: Normocephalic and atraumatic.  Eyes: Conjunctivae, EOM and lids are normal. Pupils are equal, round, and reactive to light.  Neck: Normal range of motion. Neck supple. No tracheal deviation present. No thyroid mass present.  Cardiovascular: Normal rate, regular rhythm and normal heart sounds.  Exam reveals no gallop.   No murmur heard. Pulmonary/Chest: Effort normal and breath sounds normal. No stridor. No respiratory distress. She has no decreased breath sounds. She has no wheezes. She has no rhonchi. She has no rales.  Abdominal: Soft. Normal appearance and bowel sounds are normal. She exhibits no distension. There is no tenderness. There is no rebound and no CVA tenderness.  Musculoskeletal: Normal range of motion. She exhibits no edema or tenderness.  Neurological: She is alert and oriented to person, place, and time. She has normal strength. No cranial nerve deficit or sensory deficit. GCS eye subscore is 4. GCS verbal subscore is 5. GCS motor subscore is 6.  Skin: Skin is warm and dry. No abrasion and no rash noted.  Psychiatric: She has a normal mood and affect. Her speech is normal and behavior is normal.  Nursing note and vitals reviewed.   ED Course  Procedures (including critical care time) Labs Review Labs Reviewed - No data to display  Imaging Review No results found. I have personally reviewed and evaluated these images  and lab results as part of my medical decision-making.   EKG Interpretation None      MDM   Final diagnoses:  None    Patient without red flags for subarachnoid hemorrhage or meningitis. Begin referral to neurology on call as well as primary care    Lorre Nick, MD 01/28/15 1724

## 2015-01-28 NOTE — Discharge Instructions (Signed)

## 2015-01-28 NOTE — ED Notes (Signed)
Patient states she has continues headache, but occasionally she gets a migraine. Today, patient c/o blurred vision, sensitivity to light. And a throbbing headache. Patient denies any N/V.

## 2015-01-28 NOTE — ED Notes (Signed)
Pt comes in today with a c/o migraine and blurred vision. Pt has prior hx of.

## 2015-02-12 ENCOUNTER — Encounter: Payer: Self-pay | Admitting: Neurology

## 2015-02-12 ENCOUNTER — Ambulatory Visit (INDEPENDENT_AMBULATORY_CARE_PROVIDER_SITE_OTHER): Payer: Self-pay | Admitting: Neurology

## 2015-02-12 VITALS — BP 125/85 | HR 90 | Ht 68.0 in | Wt 219.6 lb

## 2015-02-12 DIAGNOSIS — G4719 Other hypersomnia: Secondary | ICD-10-CM | POA: Insufficient documentation

## 2015-02-12 DIAGNOSIS — R51 Headache: Secondary | ICD-10-CM

## 2015-02-12 DIAGNOSIS — R0683 Snoring: Secondary | ICD-10-CM | POA: Insufficient documentation

## 2015-02-12 DIAGNOSIS — G43701 Chronic migraine without aura, not intractable, with status migrainosus: Secondary | ICD-10-CM

## 2015-02-12 DIAGNOSIS — H538 Other visual disturbances: Secondary | ICD-10-CM

## 2015-02-12 DIAGNOSIS — R519 Headache, unspecified: Secondary | ICD-10-CM | POA: Insufficient documentation

## 2015-02-12 MED ORDER — NORTRIPTYLINE HCL 10 MG PO CAPS: 20.0000 mg | ORAL_CAPSULE | Freq: Every day | ORAL | Status: AC

## 2015-02-12 NOTE — Progress Notes (Signed)
[facility]    Provider:  Dr Lucia Gaskins Referring Provider: Lorre Nick, MD Primary Care Physician:  No primary care provider on file.  CC:  migraine  HPI:  Jasmine Cunningham is a 29 y.o. female here as a referral from Dr. Freida Busman for migraines. They started in high school and are worsening. She has a constant headache. The headaches are pushing on the temples as well as left left-sided occipital pain.  Headaches are sharp and persistent. Can be throbbing and pounding when severe and then blurry vision occurs. Headaches are daily. 2x a week can be 8-10/10 of pain. She has light sensitivity, sound sensitivity, needs to be in a dark room sitting still. Has nausea, body aches. Headaches are worse in the middle of the day. She wakes up with headache. She snores at night and is excessively tired all day, wants to sleep all day. Working on a computer triggers her headaches. No food triggers. She has aura occasionally, she gets flashing lights. She was given fioricet at the emergency room. 30 headaches a month with more than half are migrainous. Had had a complete dilated fundoscopic exam recently. No other focal neurologic deficits.   Reviewed notes, labs and imaging from outside physicians, which showed:   Personally reviewed EKG tracing, QTc 430  Review of Systems: Patient complains of symptoms per HPI as well as the following symptoms: fatigue, blurred vision, SOB, swellingin legs, increased thirst, aching muscles, headache, numbness, weakness, dizziness, sleepiness, restless legs, decreased energy, disinterest in activities. Pertinent negatives per HPI. All others negative.   Social History   Social History  . Marital Status: Single    Spouse Name: N/A  . Number of Children: 1  . Years of Education: 16   Occupational History  . [employer]     Social History Main Topics  . Smoking status: Current Every Day Smoker -- 0.50 packs/day    Types: Cigarettes  . Smokeless  tobacco: Never Used  . Alcohol Use: Yes     Comment: ocassional  . Drug Use: 2.00 per week    Special: Marijuana     Comment: occasionally  . Sexual Activity: Not on file   Other Topics Concern  . Not on file   Social History Narrative   Lives at home with herself and son, 48 years old (02/12/15)   Caffeine use: 1 cup coffee every day    Family History  Problem Relation Age of Onset  . Hypertension Father   . Heart Problems Paternal Grandmother   . Migraines Neg Hx     Past Medical History  Diagnosis Date  . Migraine     Past Surgical History  Procedure Laterality Date  . Dental surgery      Current Outpatient Prescriptions  Medication Sig Dispense Refill  . nortriptyline (PAMELOR) 10 MG capsule Take 2 capsules (20 mg total) by mouth at bedtime. 60 capsule 6   No current facility-administered medications for this visit.    Allergies as of 02/12/2015  . (No Known Allergies)    Vitals: BP 125/85 mmHg  Pulse 90  Ht  (1.727 m)  Wt 219 lb 9.6 oz (99.61 kg)  BMI 33.40 kg/m2  LMP 01/07/2015 Last Weight:  Wt Readings from Last 1 Encounters:  02/12/15 219 lb 9.6 oz (99.61 kg)   Last Height:   Ht Readings from Last 1 Encounters:  02/12/15  (1.727 m)   Physical exam: Exam: Gen: NAD, conversant, well nourised, obese, well groomed  CV: RRR, no MRG. No Carotid Bruits. No peripheral edema, warm, nontender Eyes: Conjunctivae clear without exudates or hemorrhage  Neuro: Detailed Neurologic Exam  Speech:    Speech is normal; fluent and spontaneous with normal comprehension.  Cognition:    The patient is oriented to person, place, and time;     recent and remote memory intact;     language fluent;     normal attention, concentration,     fund of knowledge Cranial Nerves:    The pupils are equal, round, and reactive to light. The fundi are normal and spontaneous venous pulsations are present. Visual fields are full to finger  confrontation. Extraocular movements are intact. Trigeminal sensation is intact and the muscles of mastication are normal. The face is symmetric. The palate elevates in the midline. Hearing intact. Voice is normal. Shoulder shrug is normal. The tongue has normal motion without fasciculations.   Coordination:    Normal finger to nose and heel to shin. Normal rapid alternating movements.   Gait:    Heel-toe and tandem gait are normal.   Motor Observation:    No asymmetry, no atrophy, and no involuntary movements noted. Tone:    Normal muscle tone.    Posture:    Posture is normal. normal erect    Strength:    Strength is V/V in the upper and lower limbs.      Sensation: intact to LT     Reflex Exam:  DTR's:    Deep tendon reflexes in the upper and lower extremities are normal bilaterally.   Toes:    The toes are downgoing bilaterally.   Clonus:    Clonus is absent.       Assessment/Plan:  29 year old with chronic migraine here for evaluation. Neuro exam is non focal. She also has insomnia.  Nortriptyline  an hour before bed. Can increase to  as needed in 2-3 weeks.May help with insomnia. MRi of the brain and labs today. Sleep eval for morning headaches, obesity, snoring and excessive daytime fatigue  [name], MD  [facility] [address] [place], [place] [address]  Phone [phone] Fax [phone]

## 2015-02-12 NOTE — Patient Instructions (Signed)
Overall you are doing fairly well but I do want to suggest a few things today:   Remember to drink plenty of fluid, eat healthy meals and do not skip any meals. Try to eat protein with a every meal and eat a healthy snack such as fruit or nuts in between meals. Try to keep a regular sleep-wake schedule and try to exercise daily, particularly in the form of walking, 20-30 minutes a day, if you can.   As far as your medications are concerned, I would like to suggest: Nortriptyline  an hour before bed. Can increase to  as needed in 2-3 weeks.  As far as diagnostic testing: labs, mri brain  I would like to see you back in 3 months, sooner if we need to. Please call us with any interim questions, concerns, problems, updates or refill requests.   Please also call us for any test results so we can go over those with you on the phone.  My clinical assistant and will answer any of your questions and relay your messages to me and also relay most of my messages to you.   Our phone number is [phone]. We also have an after hours call service for urgent matters and there is a physician on-call for urgent questions. For any emergencies you know to call 911 or go to the nearest emergency room

## 2015-02-13 ENCOUNTER — Telehealth: Payer: Self-pay | Admitting: *Deleted

## 2015-02-13 LAB — COMPREHENSIVE METABOLIC PANEL
A/G RATIO: 1.4 (ref 1.1–2.5)
ALBUMIN: 4.3 g/dL (ref 3.5–5.5)
ALT: 31 IU/L (ref 0–32)
AST: 26 IU/L (ref 0–40)
Alkaline Phosphatase: 86 IU/L (ref 39–117)
BUN / CREAT RATIO: 10 (ref 8–20)
BUN: 7 mg/dL (ref 6–20)
Bilirubin Total: 0.4 mg/dL (ref 0.0–1.2)
CALCIUM: 9.2 mg/dL (ref 8.7–10.2)
CO2: 23 mmol/L (ref 18–29)
Chloride: 101 mmol/L (ref 97–108)
Creatinine, Ser: 0.69 mg/dL (ref 0.57–1.00)
GFR, EST AFRICAN AMERICAN: 134 mL/min/{1.73_m2} (ref >59)
GFR, EST NON AFRICAN AMERICAN: 116 mL/min/{1.73_m2} (ref >59)
Globulin, Total: 3 g/dL (ref 1.5–4.5)
Glucose: 84 mg/dL (ref 65–99)
POTASSIUM: 4.5 mmol/L (ref 3.5–5.2)
Sodium: 139 mmol/L (ref 134–144)
TOTAL PROTEIN: 7.3 g/dL (ref 6.0–8.5)

## 2015-02-13 NOTE — Telephone Encounter (Signed)
Left detailed VM for pt about normal labs. Gave [facility] phone number and office hours if she has further questions.

## 2015-02-13 NOTE — Telephone Encounter (Signed)
-----   Message from Anson Fret, MD sent at 02/13/2015  7:22 AM EDT ----- Please let patient know her labs were normal, thanks

## 2015-03-06 ENCOUNTER — Institutional Professional Consult (permissible substitution): Payer: Self-pay | Admitting: Neurology

## 2015-05-22 ENCOUNTER — Telehealth: Payer: Self-pay | Admitting: *Deleted

## 2015-05-22 ENCOUNTER — Ambulatory Visit: Payer: Self-pay | Admitting: Neurology

## 2015-05-22 NOTE — Telephone Encounter (Signed)
no showed f/u appt 

## 2015-05-23 ENCOUNTER — Encounter: Payer: Self-pay | Admitting: Neurology

## 2017-08-20 ENCOUNTER — Other Ambulatory Visit: Payer: Self-pay

## 2017-08-20 ENCOUNTER — Emergency Department (HOSPITAL_BASED_OUTPATIENT_CLINIC_OR_DEPARTMENT_OTHER): Payer: Managed Care, Other (non HMO)

## 2017-08-20 ENCOUNTER — Emergency Department (HOSPITAL_BASED_OUTPATIENT_CLINIC_OR_DEPARTMENT_OTHER)
Admission: EM | Admit: 2017-08-20 | Discharge: 2017-08-20 | Disposition: A | Discharge status: Home / Self Care | Payer: Managed Care, Other (non HMO) | Attending: Emergency Medicine | Admitting: Emergency Medicine

## 2017-08-20 ENCOUNTER — Encounter (HOSPITAL_BASED_OUTPATIENT_CLINIC_OR_DEPARTMENT_OTHER): Payer: Self-pay | Admitting: *Deleted

## 2017-08-20 DIAGNOSIS — M25551 Pain in right hip: Secondary | ICD-10-CM | POA: Insufficient documentation

## 2017-08-20 DIAGNOSIS — F17210 Nicotine dependence, cigarettes, uncomplicated: Secondary | ICD-10-CM | POA: Insufficient documentation

## 2017-08-20 DIAGNOSIS — M79601 Pain in right arm: Secondary | ICD-10-CM | POA: Diagnosis present

## 2017-08-20 DIAGNOSIS — M25511 Pain in right shoulder: Secondary | ICD-10-CM | POA: Insufficient documentation

## 2017-08-20 DIAGNOSIS — R200 Anesthesia of skin: Secondary | ICD-10-CM | POA: Diagnosis not present

## 2017-08-20 DIAGNOSIS — M79651 Pain in right thigh: Secondary | ICD-10-CM | POA: Diagnosis not present

## 2017-08-20 DIAGNOSIS — M79604 Pain in right leg: Secondary | ICD-10-CM

## 2017-08-20 MED ORDER — NAPROXEN 500 MG PO TABS: 500.0000 mg | ORAL_TABLET | Freq: Two times a day (BID) | ORAL | 0 refills | Status: AC

## 2017-08-20 MED FILL — NAPROXEN 500 MG TABLET: 500 | 10 days supply | Qty: 20 | Fill #0

## 2017-08-20 NOTE — Discharge Instructions (Signed)
Please read and follow all provided instructions.  Your diagnoses today include:  1. Right arm pain   2. Right leg pain     Tests performed today include:  An x-ray of the affected areas- does NOT show any broken bones  Vital signs. See below for your results today.   Medications prescribed:   Naproxen - anti-inflammatory pain medication  Do not exceed 500mg  naproxen every 12 hours, take with food  You have been prescribed an anti-inflammatory medication or NSAID. Take with food. Take smallest effective dose for the shortest duration needed for your pain. Stop taking if you experience stomach pain or vomiting.   Take any prescribed medications only as directed.  Home care instructions:   Follow any educational materials contained in this packet  Follow R.I.C.E. Protocol:  R - rest your injury   I  - use ice on injury without applying directly to skin  C - compress injury with bandage or splint  E - elevate the injury as much as possible  Follow-up instructions: Please follow-up with the provided orthopedic physician (bone specialist) if you continue to have significant pain in 1 week. In this case you may have a more severe injury that requires further care.   Return instructions:   Please return if your fingers are numb or tingling, appear gray or blue, or you have severe pain (also elevate the arm and loosen splint or wrap if you were given one)  Return with acute change in your symptoms or sudden weakness or color change, worsening pain  Please return to the Emergency Department if you experience worsening symptoms.   Please return if you have any other emergent concerns.  Additional Information:  Your vital signs today were: BP (!) 167/106    Pulse 80    Temp 98.1 F (36.7 C) (Oral)    Resp 16    Ht 5\' 8"  (1.727 m)    Wt 104.3 kg (230 lb)    LMP 08/16/2017    SpO2 100%    BMI 34.97 kg/m  If your blood pressure (BP) was elevated above 135/85 this visit,  please have this repeated by your doctor within one month. --------------

## 2017-08-20 NOTE — ED Notes (Signed)
ED Provider at bedside. 

## 2017-08-20 NOTE — ED Triage Notes (Signed)
Arm numbness for several months. She has good ROM.

## 2017-08-20 NOTE — ED Provider Notes (Signed)
[facility] EMERGENCY DEPARTMENT Provider Note   CSN: [id number] Arrival date & time: 08/20/17  1415     History   Chief Complaint Chief Complaint  Patient presents with  . Arm Pain    HPI Jasmine Cunningham is a 32 y.o. female.  Patient presents to the emergency department today with 3-772-month history of progressive right upper extremity numbness and pain.  She has episodes several times a week of pain and tightness in her right shoulder.  She does not have full complete numb sensation.  She states that her hand feels cool at times.  No color change.  Certain motions and positions make the pain worse.  She has some associated neck soreness.  She also has pain over the lateral aspect of her hip and thigh on the right side.  She reports weakness, but is able to walk without difficulty.  No severe neck pain.  No left-sided symptoms.  No treatments prior to arrival.  No history of shoulder or neck surgeries. The onset of this condition was incidious. Alleviating factors: none.       Past Medical History:  Diagnosis Date  . Migraine     Patient Active Problem List   Diagnosis Date Noted  . Chronic migraine without aura with status migrainosus, not intractable 02/12/2015  . Worsening headaches 02/12/2015  . Blurry vision, bilateral 02/12/2015  . Snoring 02/12/2015  . Excessive daytime sleepiness 02/12/2015  . Morning headache 02/12/2015    Past Surgical History:  Procedure Laterality Date  . DENTAL SURGERY       OB History   None      Home Medications    Prior to Admission medications   Medication Sig Start Date End Date Taking? Authorizing Provider  nortriptyline (PAMELOR) 10 MG capsule Take 2 capsules (20 mg total) by mouth at bedtime. 02/12/15   Anson FretAhern, Antonia B, MD    Family History Family History  Problem Relation Age of Onset  . Hypertension Father   . Heart Problems Paternal Grandmother   . Migraines Neg Hx     Social History Social History    Tobacco Use  . Smoking status: Current Every Day Smoker    Packs/day: 0.50    Types: Cigarettes  . Smokeless tobacco: Never Used  Substance Use Topics  . Alcohol use: Yes    Comment: ocassional  . Drug use: Yes    Frequency: 2.0 times per week    Types: Marijuana    Comment: occasionally     Allergies   Patient has no known allergies.   Review of Systems Review of Systems  Constitutional: Negative for fever and unexpected weight change.  HENT: Negative for rhinorrhea and sore throat.   Eyes: Negative for redness.  Respiratory: Negative for cough.   Cardiovascular: Negative for chest pain.  Gastrointestinal: Negative for abdominal pain, constipation, diarrhea, nausea and vomiting.       Negative for fecal incontinence.   Genitourinary: Negative for dysuria, flank pain, hematuria, pelvic pain, vaginal bleeding and vaginal discharge.       Negative for urinary incontinence or retention.  Musculoskeletal: Positive for neck pain. Negative for back pain and myalgias.  Skin: Negative for rash.  Neurological: Positive for weakness and numbness. Negative for headaches.       Denies saddle paresthesias.     Physical Exam Updated Vital Signs BP (!) 167/106   Pulse 80   Temp 98.1 F (36.7 C) (Oral)   Resp 16   Ht 5'  8" (1.727 m)   Wt 104.3 kg (230 lb)   LMP 08/16/2017   SpO2 100%   BMI 34.97 kg/m   Physical Exam  Constitutional: She appears well-developed and well-nourished.  HENT:  Head: Normocephalic and atraumatic.  Eyes: Pupils are equal, round, and reactive to light.  Neck: Normal range of motion. Neck supple.  Cardiovascular: Normal pulses. Exam reveals no decreased pulses.  Pulses:      Radial pulses are 2+ on the right side, and 2+ on the left side.  Musculoskeletal: She exhibits tenderness. She exhibits no edema.       Right shoulder: She exhibits tenderness. She exhibits normal range of motion, no bony tenderness and no swelling.       Right elbow:  Normal.She exhibits normal range of motion, no swelling and no effusion.       Right wrist: Normal.       Right hip: She exhibits tenderness. She exhibits normal range of motion and normal strength.       Left hip: Normal.       Right knee: Normal.       Cervical back: She exhibits tenderness (Right lateral paraspinous musculature). She exhibits normal range of motion and no bony tenderness.       Thoracic back: Normal.       Lumbar back: Normal.       Right upper arm: She exhibits tenderness.       Right forearm: She exhibits tenderness.       Right upper leg: Normal.  Neurological: She is alert. No sensory deficit.  At time of exam, patient reports generalized tenderness of the right upper extremity in all nerve distributions.  She has capillary refill less than 2 seconds and 2+, symmetric bilateral radial pulses.  Patient has a completely positive Roos test and becomes tearful after performing this for about 10 seconds due to pain and she cannot continue.  5/5 upper and lower extremity strength bilaterally.   She is able to ambulate without difficulty.  No foot drop or apparent leg weakness.  Skin: Skin is warm and dry.  Psychiatric: She has a normal mood and affect.  Nursing note and vitals reviewed.    ED Treatments / Results  Labs (all labs ordered are listed, but only abnormal results are displayed) Labs Reviewed - No data to display  EKG None  Radiology Dg Cervical Spine Complete  Result Date: 08/20/2017 CLINICAL DATA:  Right shoulder and neck pain for 2 months without injury. EXAM: CERVICAL SPINE - COMPLETE 4+ VIEW COMPARISON:  None. FINDINGS: No fracture. No subluxation. Intervertebral disc spaces are preserved throughout. The facets are well aligned bilaterally. No substantial degenerative change. IMPRESSION: Negative cervical spine radiographs. Electronically Signed   By: Kennith Center M.D.   On: 08/20/2017 15:03   Dg Shoulder Right  Result Date: 08/20/2017 CLINICAL  DATA:  Right shoulder and neck pain for 2 months without injury. EXAM: RIGHT SHOULDER - 2+ VIEW COMPARISON:  None. FINDINGS: Three-view exam of the right shoulder demonstrates no evidence for an acute fracture. No evidence for shoulder separation or dislocation. No suspicious lytic or sclerotic osseous abnormality. No appreciable degenerative changes in the [facility] joint or glenohumeral joint. IMPRESSION: Negative. Electronically Signed   By: Kennith Center M.D.   On: 08/20/2017 15:02    Procedures Procedures (including critical care time)  Medications Ordered in ED Medications - No data to display   Initial Impression / Assessment and Plan / ED Course  I  have reviewed the triage vital signs and the nursing notes.  Pertinent labs & imaging results that were available during my care of the patient were reviewed by me and considered in my medical decision making (see chart for details).     Patient seen and examined. Work-up initiated.   Patient with constellation of symptoms.  Arm paresthesias and tenderness concerning for thoracic outlet obstruction.  Patient has a positive Roos test.  Will obtain imaging of the shoulder and cervical spine to ensure no obvious anatomic variation.  She will need follow-up with sports medicine for further evaluation.  At time of exam there is no signs of acute arterial or venous obstruction.  No significant swelling.  Patient does not have numbness and reports tenderness anywhere in the upper arm, shoulder, and upper chest wall.  She does not have severe neck pain.  Consider central cord syndrome given right sided leg pain, however patient does not have any weakness at time of exam.  She ambulates normally.  She does not have any left-sided symptoms.  I have lower concern for this and I do not feel that the patient requires emergent MRI at this time.  If imaging is negative, will attempt control of symptoms with activity modification and NSAIDs.  Patient notes that  her symptoms have not abruptly worsened.  She is encouraged to return with new or abrupt changing of symptoms.  She will be given sports medicine follow-up.  Vital signs reviewed and are as follows: BP (!) 167/106   Pulse 80   Temp 98.1 F (36.7 C) (Oral)   Resp 16   Ht 5\' 8"  (1.727 m)   Wt 104.3 kg (230 lb)   LMP 08/16/2017   SpO2 100%   BMI 34.97 kg/m   3:34 PM x-rays reviewed by myself.  Patient updated on results.  Plan as above.  Reiterated need to return with any acute worsening or changing of symptoms including worsening pain, change in temperature or color of the extremity.  Final Clinical Impressions(s) / ED Diagnoses   Final diagnoses:  Right arm pain  Right leg pain   Patient right upper extremity arm pain, progressive.  Imaging is negative.  Thoracic outlet syndrome is possible differential.  This would not explain her lower extremity symptoms.  She does not have any neuro deficits on exam.  No acute arterial obstruction suspected.  Do not feel patient requires emergent cervical spine MRI at this time.  Symptoms to be atypical for MS or other demyelinating illness.  Feel next step is for her to follow-up with an orthopedic physician for further evaluation.  Return instructions as above and patient seems to be reliable to return if worsening or changing occurs.  ED Discharge Orders    None       Renne Crigler, Cordelia Poche 08/20/17 1535    Vanetta Mulders, MD 08/28/17 [phone]

## 2017-09-01 ENCOUNTER — Other Ambulatory Visit (HOSPITAL_COMMUNITY): Payer: Self-pay | Admitting: Obstetrics and Gynecology

## 2017-09-01 DIAGNOSIS — R102 Pelvic and perineal pain: Secondary | ICD-10-CM

## 2017-09-23 ENCOUNTER — Ambulatory Visit (HOSPITAL_COMMUNITY)
Admission: RE | Admit: 2017-09-23 | Discharge: 2017-09-23 | Disposition: A | Discharge status: Home / Self Care | Payer: Managed Care, Other (non HMO) | Source: Ambulatory Visit | Attending: Obstetrics and Gynecology | Admitting: Obstetrics and Gynecology

## 2017-09-23 DIAGNOSIS — R102 Pelvic and perineal pain: Secondary | ICD-10-CM | POA: Insufficient documentation

## 2018-12-12 IMAGING — CR DG CERVICAL SPINE COMPLETE 4+V
6 series · 6 of 6 positions shown · non-contrast
Comparison: None.

CLINICAL DATA: Right shoulder and neck pain for 2 months without
injury.

EXAM:
CERVICAL SPINE - COMPLETE 4+ VIEW

[w c-spine lat]
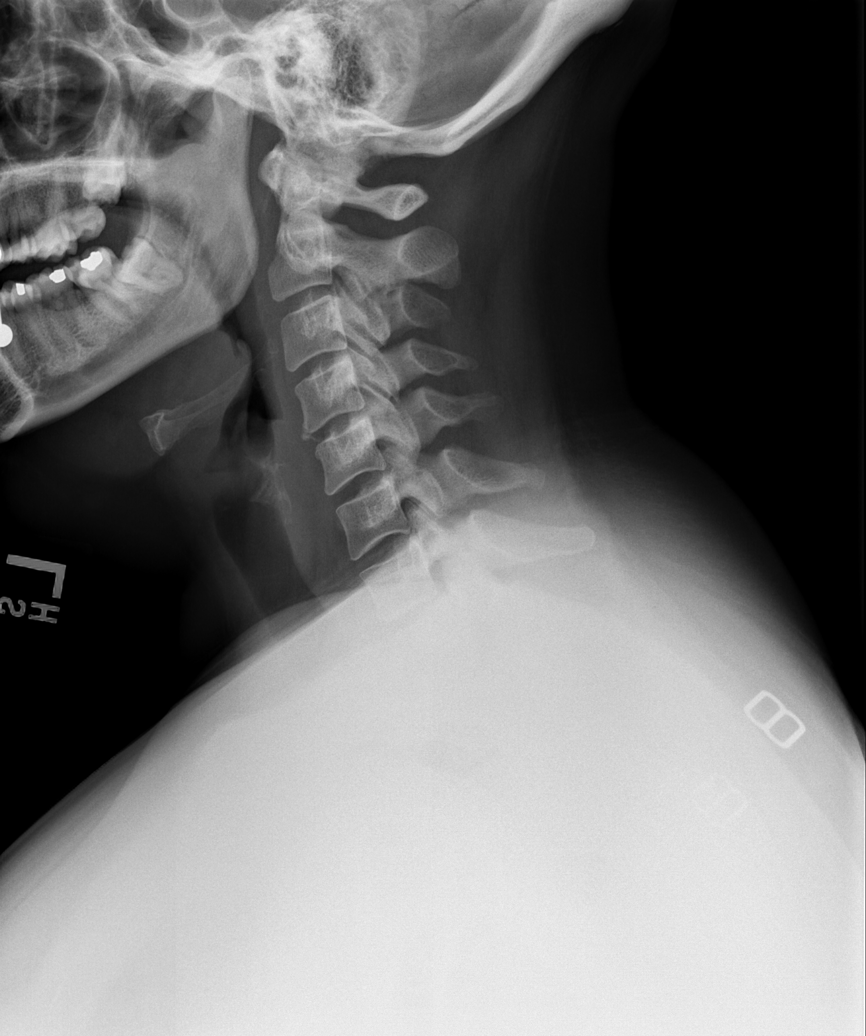

[w c-spine oblique (1 of 2)]
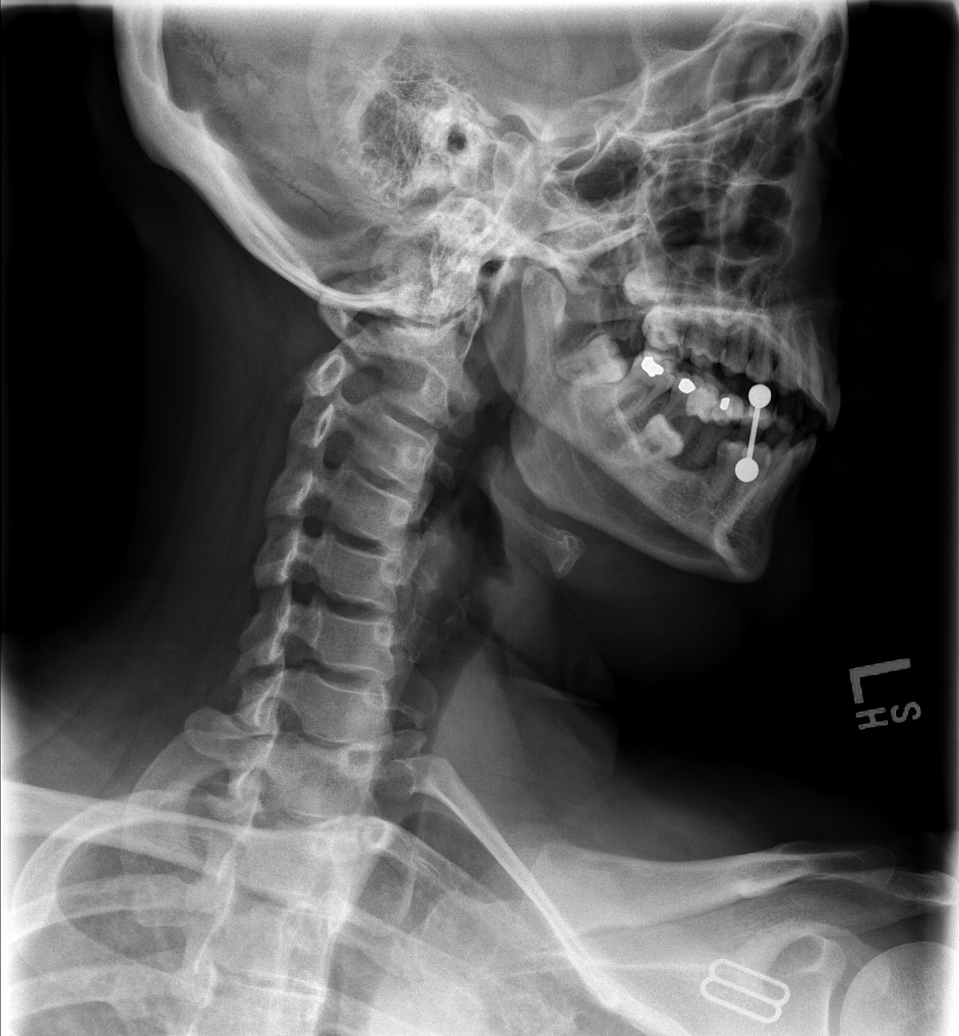

[w c-spine oblique (2 of 2)]
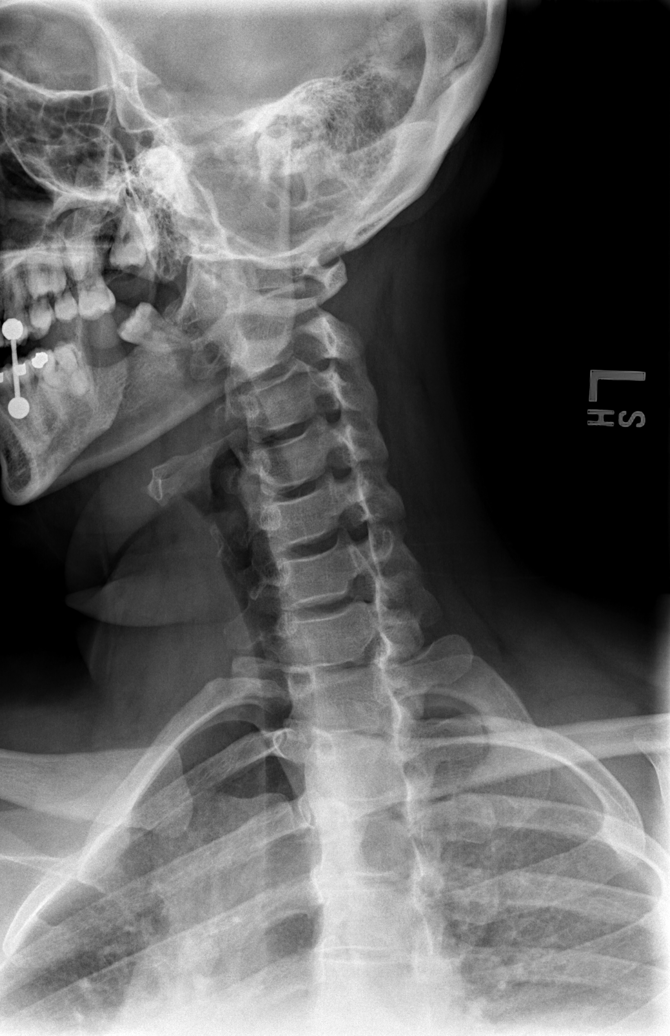

[w c-spine a.p.]
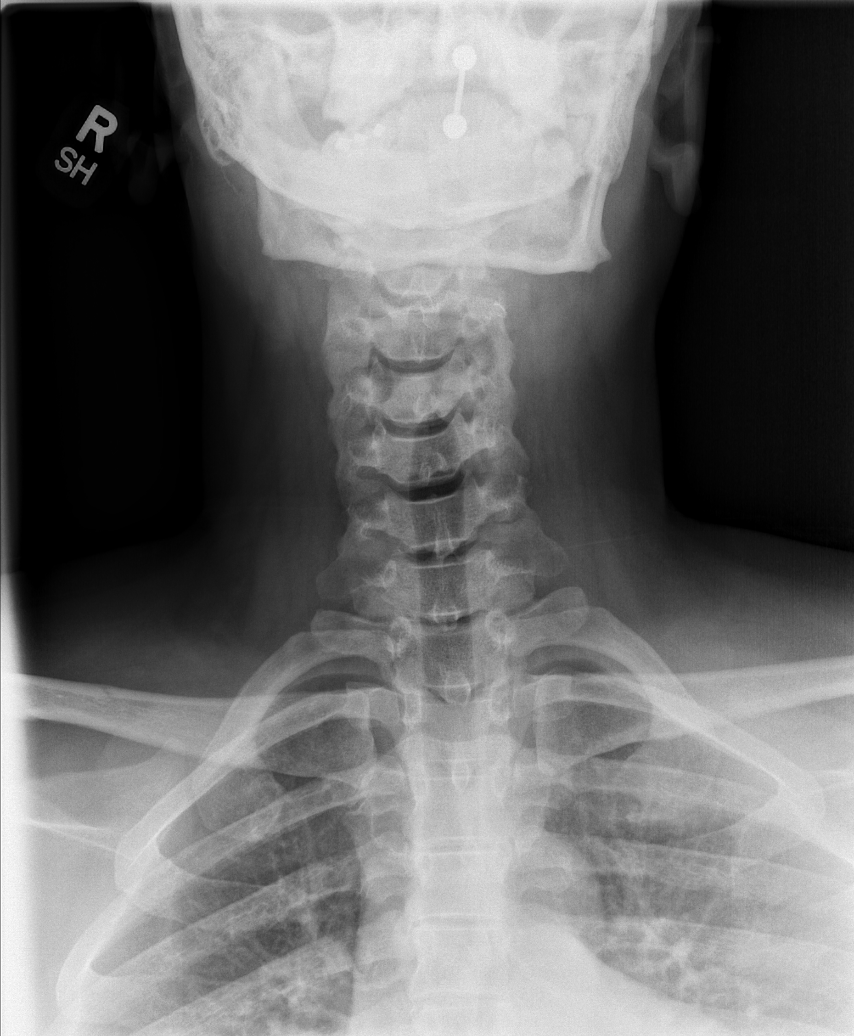

[w c-spine odontoid]
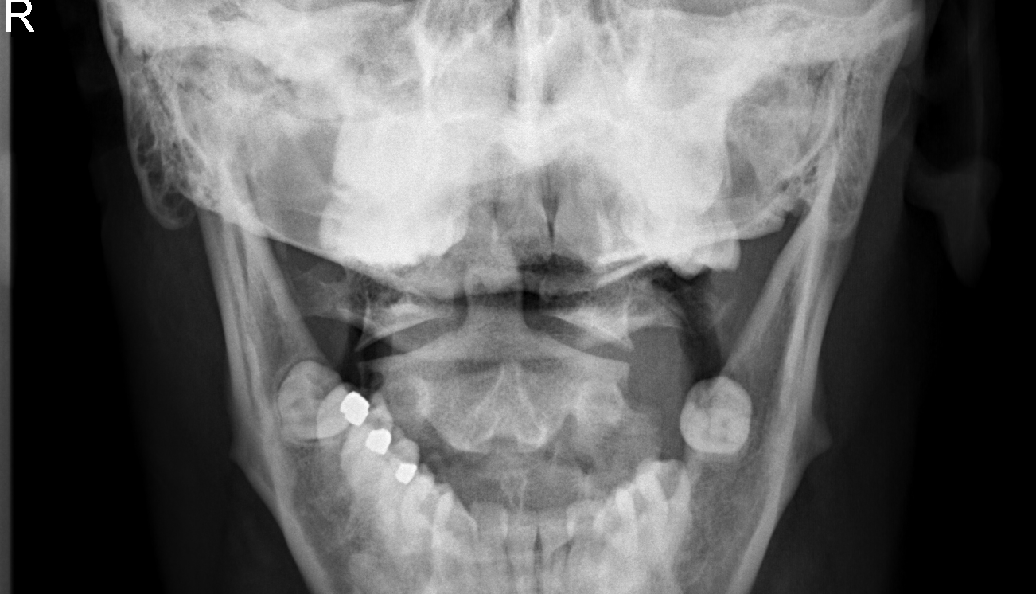

[w swimmers view]
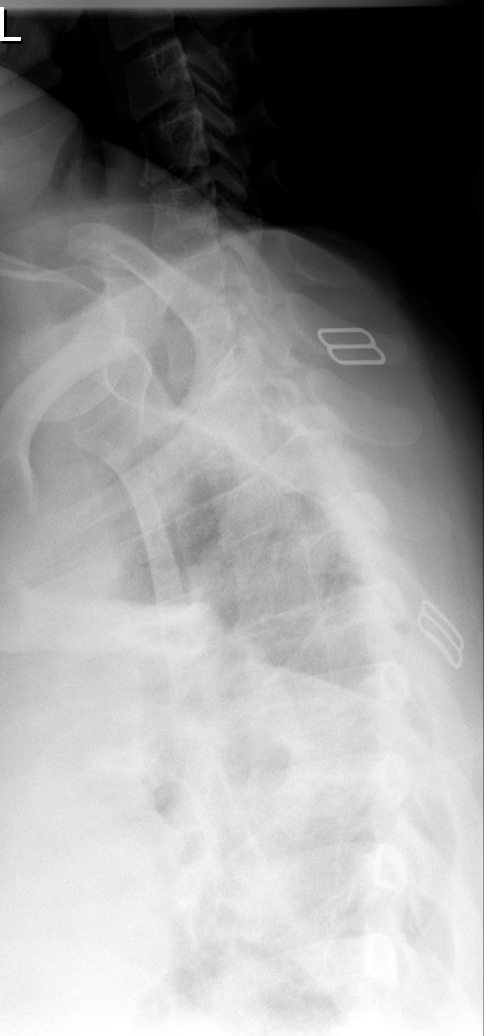

[6 of 6 positions shown; findings below may reference images not displayed]

FINDINGS: No fracture. No subluxation. Intervertebral disc spaces are
preserved throughout. The facets are well aligned bilaterally. No
substantial degenerative change.
IMPRESSION: Negative cervical spine radiographs.

## 2019-01-15 IMAGING — US US PELVIS COMPLETE TRANSABD/TRANSVAG
1 series · 15 of 25 positions shown · non-contrast
Comparison: None

CLINICAL DATA: Patient with pelvic pain.

EXAM:
TRANSABDOMINAL AND TRANSVAGINAL ULTRASOUND OF PELVIS
TECHNIQUE: Both transabdominal and transvaginal ultrasound examinations of the
pelvis were performed. Transabdominal technique was performed for
global imaging of the pelvis including uterus, ovaries, adnexal
regions, and pelvic cul-de-sac. It was necessary to proceed with
endovaginal exam following the transabdominal exam to visualize the
adnexal structures.

[Series 1: us pelvis complete transabd/transvag · 15 of 53 slices shown]
[im 1/53]
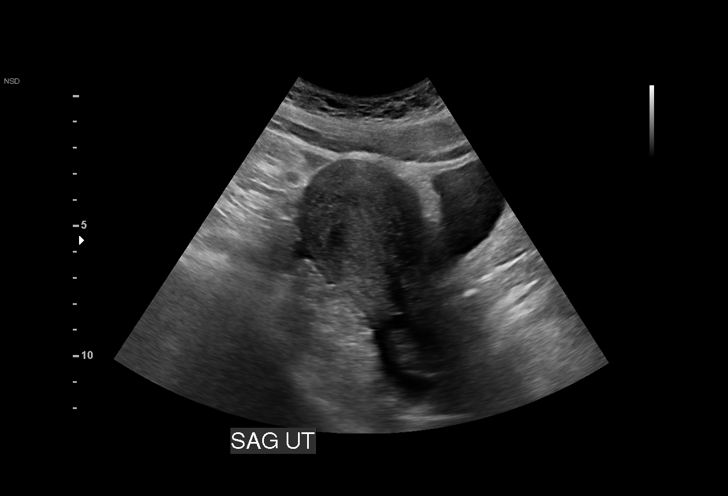
[im 5/53]
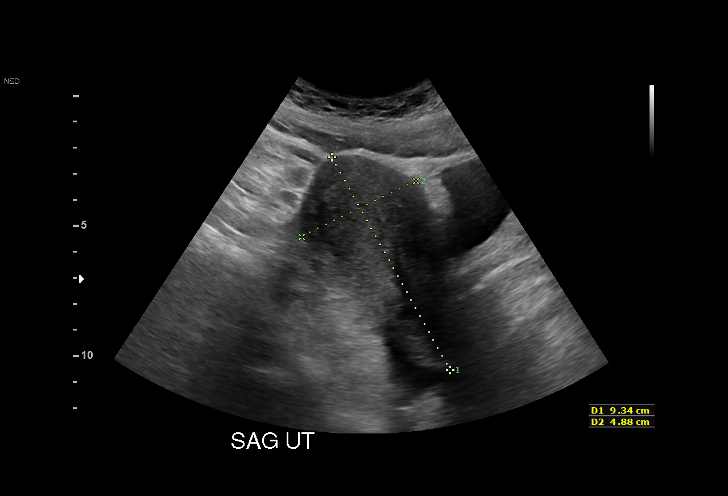
[im 9/53]
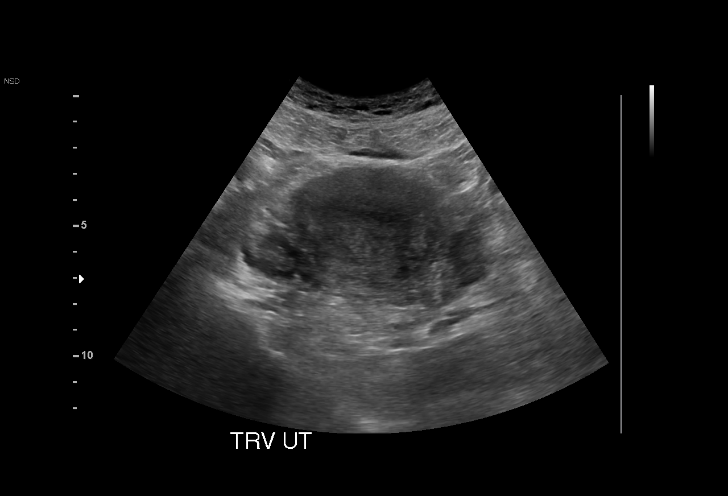
[im 11/53]
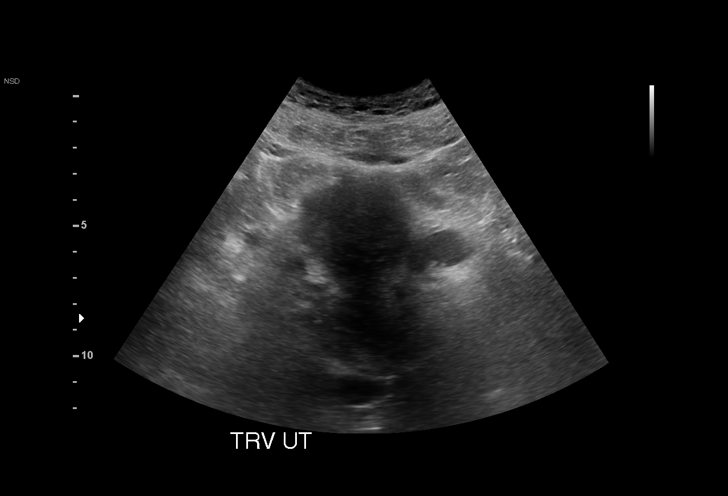
[im 16/53]
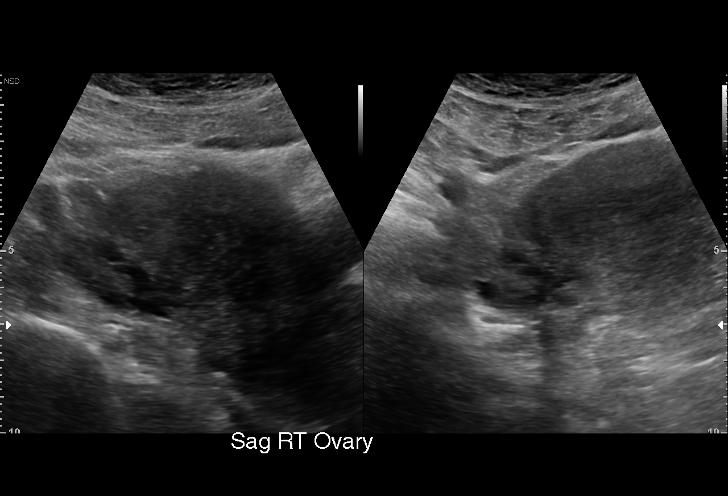
[im 20/53]
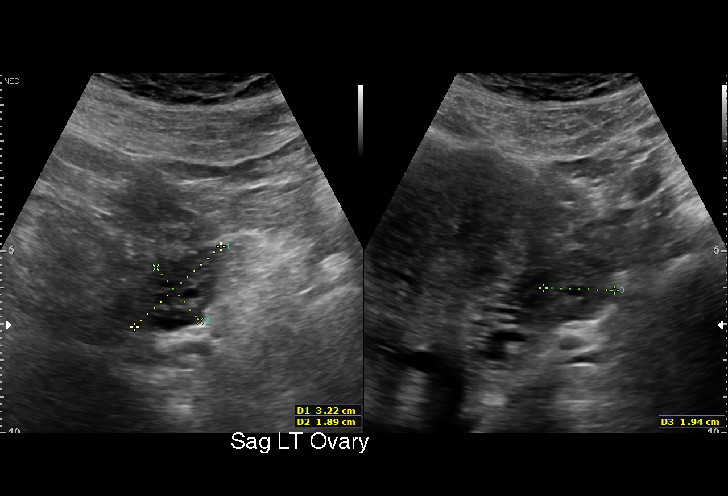
[im 22/53]
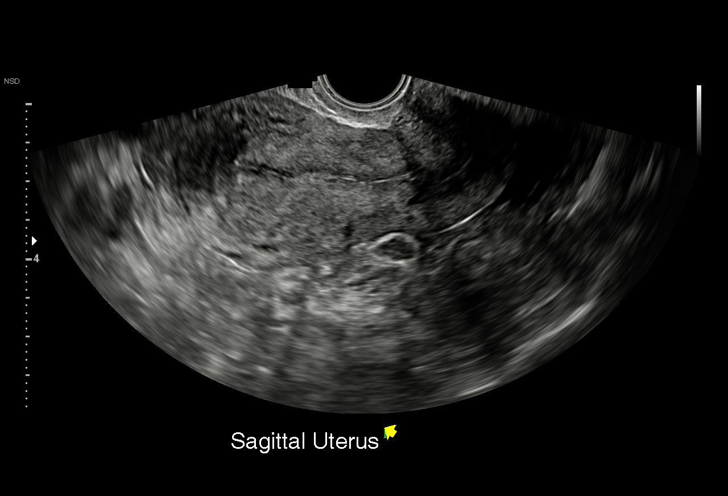
[im 27/53]
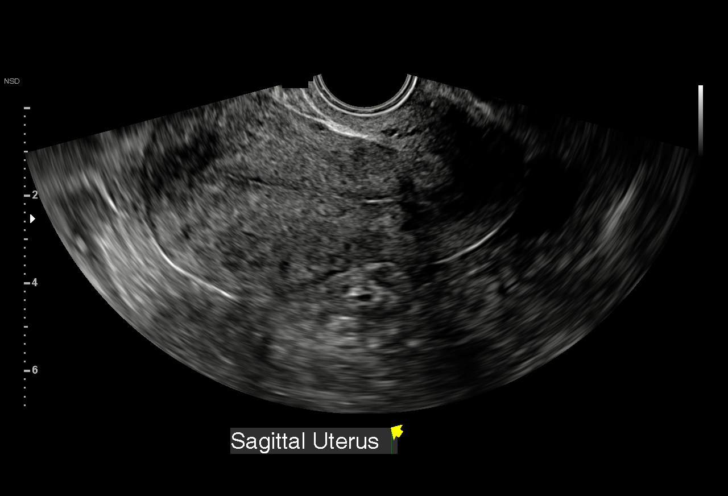
[im 31/53]
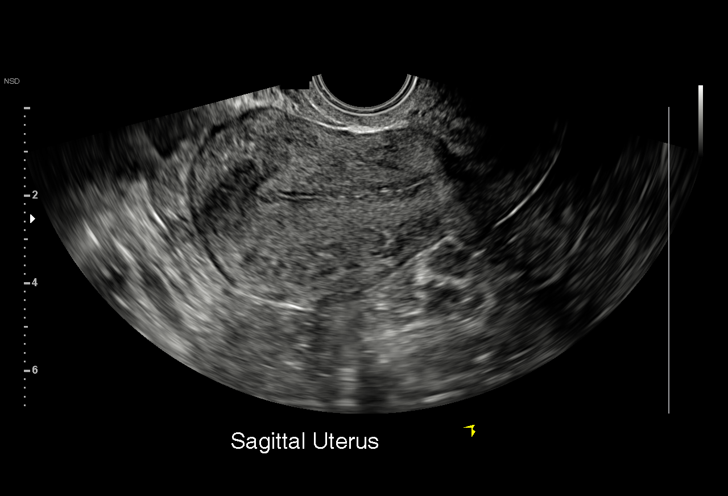
[im 33/53]
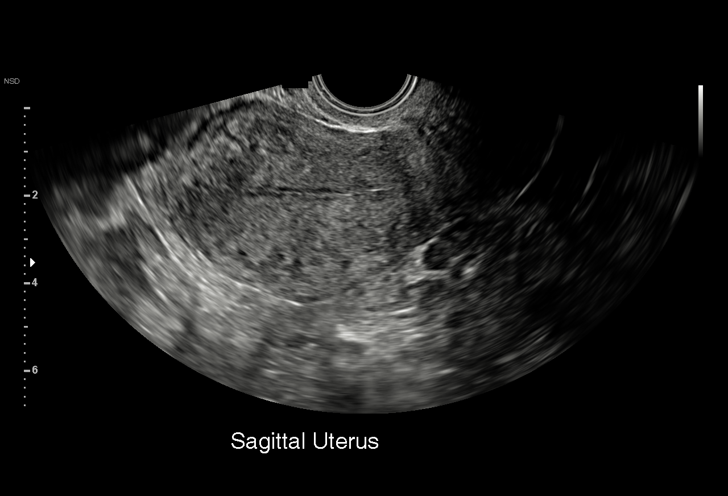
[im 37/53]
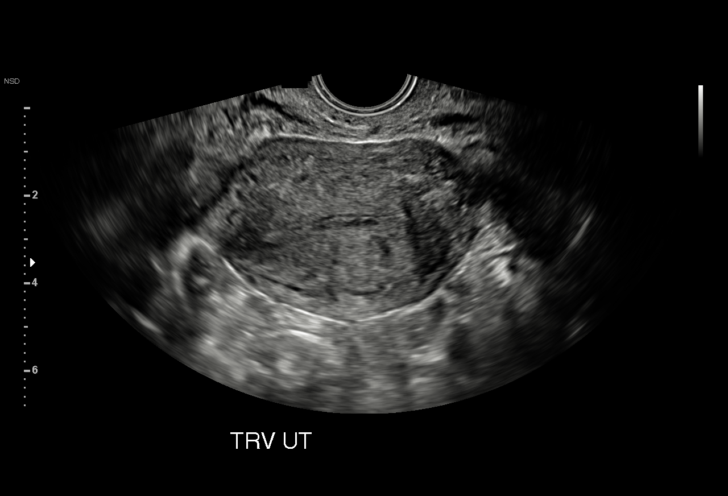
[im 42/53]
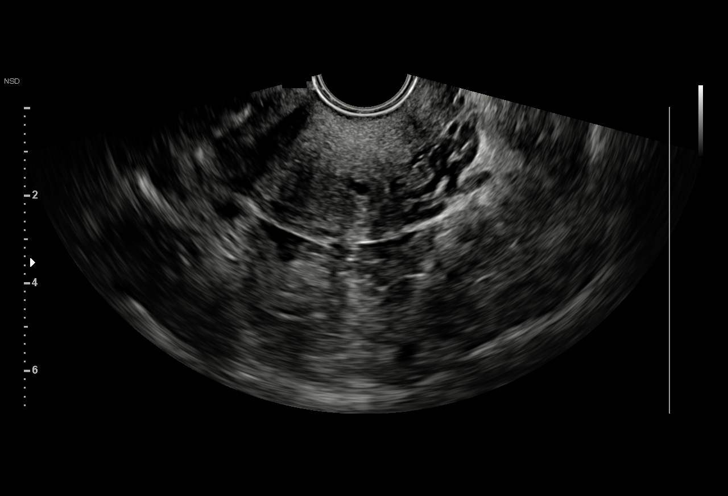
[im 44/53]
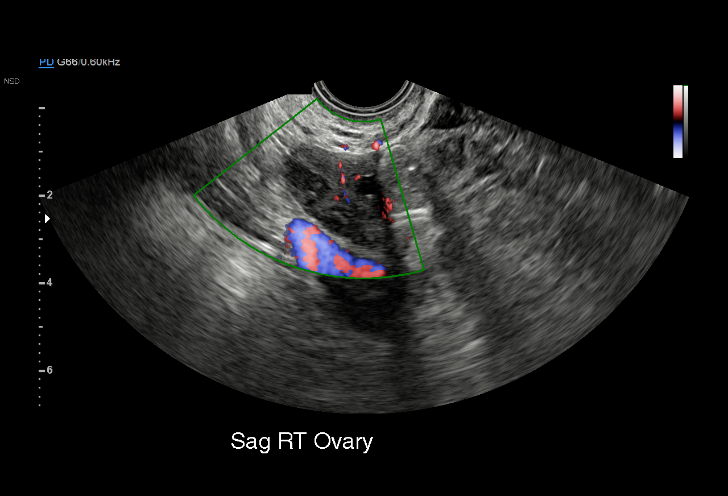
[im 48/53]
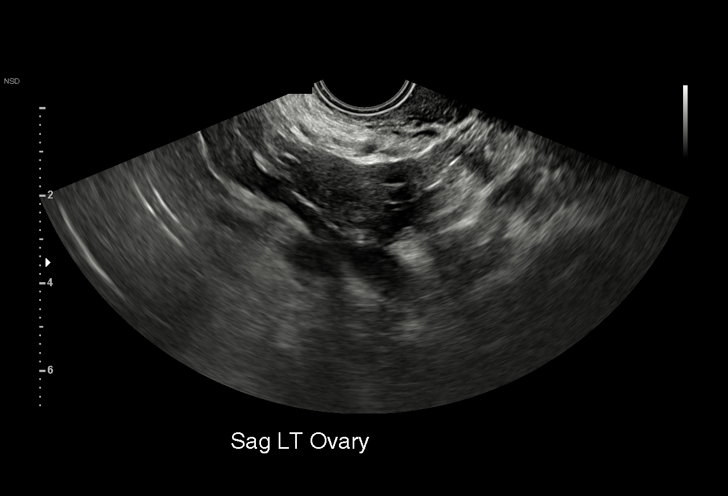
[im 53/53]
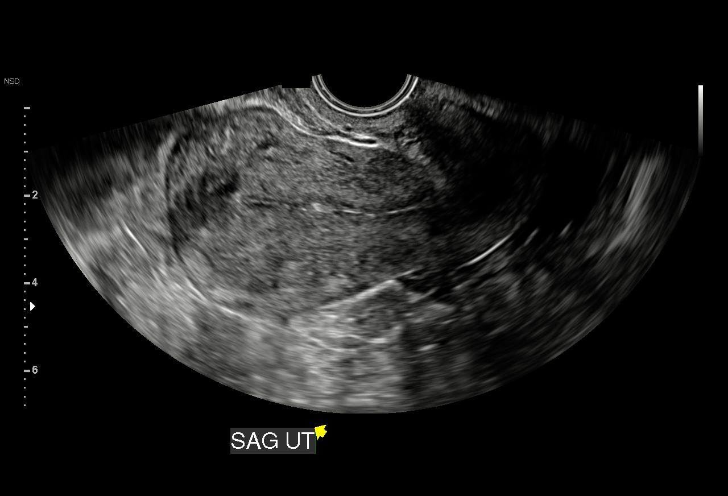

[15 of 25 positions shown; findings below may reference images not displayed]

FINDINGS: Uterus

Measurements: 8.8 x 4.6 x 6.3 cm. No fibroids or other mass
visualized.

Endometrium

Thickness: 7 mm.  No focal abnormality visualized.

Right ovary

Measurements: 3.4 x 1.8 x 2.3 cm. Normal appearance/no adnexal mass.

Left ovary

Measurements: 3.7 x 2.0 x 2.3 cm. Normal appearance/no adnexal mass.

Other findings

Small amount of fluid in the pelvis.
IMPRESSION: No acute process.

## 2019-03-30 ENCOUNTER — Ambulatory Visit: Payer: Managed Care, Other (non HMO) | Admitting: Family Medicine

## 2019-03-31 ENCOUNTER — Ambulatory Visit: Payer: Managed Care, Other (non HMO) | Admitting: Family Medicine

## 2020-05-13 ENCOUNTER — Other Ambulatory Visit: Payer: Self-pay

## 2020-05-13 ENCOUNTER — Emergency Department (HOSPITAL_COMMUNITY)
Admission: EM | Admit: 2020-05-13 | Discharge: 2020-05-13 | Disposition: A | Discharge status: Home / Self Care | Payer: 59 | Attending: Emergency Medicine | Admitting: Emergency Medicine

## 2020-05-13 ENCOUNTER — Encounter (HOSPITAL_COMMUNITY): Payer: Self-pay | Admitting: Obstetrics and Gynecology

## 2020-05-13 DIAGNOSIS — R110 Nausea: Secondary | ICD-10-CM | POA: Diagnosis not present

## 2020-05-13 DIAGNOSIS — U071 COVID-19: Secondary | ICD-10-CM | POA: Insufficient documentation

## 2020-05-13 DIAGNOSIS — M791 Myalgia, unspecified site: Secondary | ICD-10-CM | POA: Insufficient documentation

## 2020-05-13 DIAGNOSIS — Z5321 Procedure and treatment not carried out due to patient leaving prior to being seen by health care provider: Secondary | ICD-10-CM | POA: Diagnosis not present

## 2020-05-13 DIAGNOSIS — R519 Headache, unspecified: Secondary | ICD-10-CM | POA: Diagnosis present

## 2020-05-13 LAB — RESP PANEL BY RT-PCR (FLU A&B, COVID) ARPGX2
Influenza A by PCR: NEGATIVE
Influenza B by PCR: NEGATIVE
SARS Coronavirus 2 by RT PCR: POSITIVE — AB

## 2020-05-13 NOTE — ED Triage Notes (Signed)
Patient reports she started to feel bad today around 1pm. Patient reports nausea, headaches, generalized body aches, and overall feeling poorly. Patient reports she has had her vaccines. Patient states she was around her family for christmas but no known sick contacts.

## 2021-02-02 ENCOUNTER — Encounter (HOSPITAL_COMMUNITY): Payer: Self-pay

## 2021-02-02 ENCOUNTER — Emergency Department (HOSPITAL_COMMUNITY)
Admission: EM | Admit: 2021-02-02 | Discharge: 2021-02-02 | Disposition: A | Discharge status: Home / Self Care | Payer: 59 | Attending: Emergency Medicine | Admitting: Emergency Medicine

## 2021-02-02 DIAGNOSIS — L509 Urticaria, unspecified: Secondary | ICD-10-CM | POA: Diagnosis present

## 2021-02-02 DIAGNOSIS — F17210 Nicotine dependence, cigarettes, uncomplicated: Secondary | ICD-10-CM | POA: Diagnosis not present

## 2021-02-02 MED ORDER — METHYLPREDNISOLONE SODIUM SUCC 125 MG IJ SOLR
125.0000 mg | Freq: Once | INTRAMUSCULAR | Status: AC
  Administered 2021-02-02: 125 mg via INTRAVENOUS
  Filled 2021-02-02: qty 2

## 2021-02-02 MED ORDER — PREDNISONE 20 MG PO TABS: 40.0000 mg | ORAL_TABLET | Freq: Every day | ORAL | 0 refills | Status: DC

## 2021-02-02 MED ORDER — DIPHENHYDRAMINE HCL 50 MG/ML IJ SOLN
25.0000 mg | Freq: Once | INTRAMUSCULAR | Status: AC
  Administered 2021-02-02: 25 mg via INTRAVENOUS
  Filled 2021-02-02: qty 1

## 2021-02-02 MED ORDER — FAMOTIDINE IN NACL 20-0.9 MG/50ML-% IV SOLN
20.0000 mg | INTRAVENOUS | Status: AC
  Administered 2021-02-02: 20 mg via INTRAVENOUS
  Filled 2021-02-02: qty 50

## 2021-02-02 NOTE — ED Triage Notes (Signed)
Pt came in with c/o scattered hives times two to three weeks. Pt states that it has gotten more severe tonight. Hives are on legs, hands, feet, back. Pt has not taken any medication for it

## 2021-02-02 NOTE — ED Provider Notes (Signed)
[facility]-EMERGENCY DEPT Provider Note   CSN: [id number] Arrival date & time: 02/02/21  7893     History Chief Complaint  Patient presents with   Urticaria    Jasmine Cunningham is a 35 y.o. female.  Patient presents to the emergency department with a chief complaint of hives.  She states that she has had it for the past couple weeks.  She states that the rash moves from day-to-day.  She describes it as itchy.  She denies any known allergies.  Denies any new potential contacts with allergens.  Denies any treatments prior to arrival.  She denies nausea, vomiting, diarrhea, throat swelling, shortness of breath.  The history is provided by the patient. No language interpreter was used.      Past Medical History:  Diagnosis Date   Migraine     Patient Active Problem List   Diagnosis Date Noted   Chronic migraine without aura with status migrainosus, not intractable 02/12/2015   Worsening headaches 02/12/2015   Blurry vision, bilateral 02/12/2015   Snoring 02/12/2015   Excessive daytime sleepiness 02/12/2015   Morning headache 02/12/2015    Past Surgical History:  Procedure Laterality Date   DENTAL SURGERY       OB History   No obstetric history on file.     Family History  Problem Relation Age of Onset   Hypertension Father    Heart Problems Paternal Grandmother    Migraines Neg Hx     Social History   Tobacco Use   Smoking status: Every Day    Packs/day: 0.50    Types: Cigarettes   Smokeless tobacco: Never  Substance Use Topics   Alcohol use: Yes    Comment: ocassional   Drug use: Yes    Frequency: 2.0 times per week    Types: Marijuana    Comment: occasionally    Home Medications Prior to Admission medications   Medication Sig Start Date End Date Taking? Authorizing Provider  naproxen (NAPROSYN) 500 MG tablet Take 1 tablet (500 mg total) by mouth 2 (two) times daily. 08/20/17   Renne Crigler, PA-C  nortriptyline (PAMELOR) 10  MG capsule Take 2 capsules (20 mg total) by mouth at bedtime. 02/12/15   Anson Fret, MD    Allergies    Patient has no known allergies.  Review of Systems   Review of Systems  All other systems reviewed and are negative.  Physical Exam Updated Vital Signs BP (!) 178/130 (BP Location: Left Arm)   Pulse 91   Temp 98.7 F (37.1 C) (Oral)   Resp 18   Ht 5\' 8"  (1.727 m)   Wt 95.3 kg   SpO2 98%   BMI 31.93 kg/m   Physical Exam Vitals and nursing note reviewed.  Constitutional:      General: She is not in acute distress.    Appearance: She is well-developed.  HENT:     Head: Normocephalic and atraumatic.  Eyes:     Conjunctiva/sclera: Conjunctivae normal.  Cardiovascular:     Rate and Rhythm: Normal rate.     Heart sounds: No murmur heard. Pulmonary:     Effort: Pulmonary effort is normal. No respiratory distress.  Abdominal:     General: There is no distension.  Musculoskeletal:     Cervical back: Neck supple.     Comments: Moves all extremities  Skin:    General: Skin is warm and dry.     Comments: Diffuse hives  Neurological:  Mental Status: She is alert and oriented to person, place, and time.  Psychiatric:        Mood and Affect: Mood normal.        Behavior: Behavior normal.    ED Results / Procedures / Treatments   Labs (all labs ordered are listed, but only abnormal results are displayed) Labs Reviewed - No data to display  EKG None  Radiology No results found.  Procedures Procedures   Medications Ordered in ED Medications  methylPREDNISolone sodium succinate (SOLU-MEDROL) 125 mg/2 mL injection 125 mg (has no administration in time range)  famotidine (PEPCID) IVPB 20 mg premix (has no administration in time range)  diphenhydrAMINE (BENADRYL) injection 25 mg (has no administration in time range)    ED Course  I have reviewed the triage vital signs and the nursing notes.  Pertinent labs & imaging results that were available during my  care of the patient were reviewed by me and considered in my medical decision making (see chart for details).    MDM Rules/Calculators/A&P                           Patient here with hives x2 to 3 weeks.  Will give Solu-Medrol, Pepcid, and Benadryl.  Anticipate discharge home with prednisone.  Recommend follow-up with dermatology if not improving.  Patient has no evidence of anaphylactic reaction.  There is no evidence of cellulitis or abscess.  She does not appear toxic. Final Clinical Impression(s) / ED Diagnoses Final diagnoses:  Urticaria    Rx / DC Orders ED Discharge Orders     None        Jasmine Horseman, PA-C 02/02/21 0610    Molpus, Jonny Ruiz, MD 02/02/21 [phone]

## 2021-02-02 NOTE — Discharge Instructions (Addendum)
Take the prednisone taper as prescribed.  You can also use Benadryl OR Zyrtec. These are available over-the-counter.  Take as directed on the bottle.

## 2021-09-17 ENCOUNTER — Encounter: Payer: Self-pay | Admitting: Allergy

## 2021-09-17 ENCOUNTER — Ambulatory Visit (INDEPENDENT_AMBULATORY_CARE_PROVIDER_SITE_OTHER): Payer: Managed Care, Other (non HMO) | Admitting: Allergy

## 2021-09-17 VITALS — BP 156/92 | HR 100 | Temp 98.1°F | Resp 16 | Ht 66.5 in | Wt 238.6 lb

## 2021-09-17 DIAGNOSIS — H1013 Acute atopic conjunctivitis, bilateral: Secondary | ICD-10-CM

## 2021-09-17 DIAGNOSIS — L508 Other urticaria: Secondary | ICD-10-CM

## 2021-09-17 DIAGNOSIS — H109 Unspecified conjunctivitis: Secondary | ICD-10-CM

## 2021-09-17 DIAGNOSIS — T783XXD Angioneurotic edema, subsequent encounter: Secondary | ICD-10-CM | POA: Diagnosis not present

## 2021-09-17 DIAGNOSIS — J310 Chronic rhinitis: Secondary | ICD-10-CM

## 2021-09-17 NOTE — Progress Notes (Signed)
? ? ?New Patient Note ? ?RE: Jasmine Cunningham MRN: [id number] DOB: 12-Feb-1986 ?Date of Office Visit: 09/17/2021 ? ? ?Primary care provider: Deatra James, MD ? ?Chief Complaint: hives/rash ? ?History of present illness: ?Jasmine Cunningham is a 36 y.o. female presenting today for evaluation of urticaria.   ? ?She has been having a hive rash since Aug 2022.  Hives are occurring almost daily if she does not take an allergy pill.  She has hydroxzyine that she takes 1 tab a day.  She also has clarinex that she is taking 1 tab in the morning that she states helps with her eye and nasal symptoms but not really with the hives.  She has been on desloratadine since October 2022.  She states the hives can look like she has scratched herself a lot to looking like someone "burned me with a cigarette".  She has provided pictures and she does have pictures that resemble red lines with welts from scratch marks as well as pictures of more round, red raised lesions both consistent with urticaria.  She does feel like her lips get tingly and tight feeling.   ?The hives can last a week (the ones that look like cigarette burns) and the hives that are red from scratching go away quickly.   ?No fevers.  She does report joint aches/pain.  Hot showers can worsen flare. Hives to seem to be pressured induced.  No preceding illnesses.  ?She does states she gets itchier and sees more hives after eating nuts.  ?No concern for stings/bites.  No change in soap/detergent/body products.  She has been given prednisone course which did help but states made her gain weight.  She does take advil for headache often.   ?She did see allergist at [facility] for this issue and had allergy testing by skin testing done to foods only and states she recalls being positive to chicken, cabbage, nuts.  She state she eats chicken all the time and has never noted any issues.  She states she has stayed away from nuts but has continued to eat chicken.  She  was not prescribed an epinephrine device with this testing.  ? ?She does note as a child after eating chocolate she would get itchy.  ? ?She does have itchy/watery eyes, itchy throat, nasal congestion/drainage, sneezing.  Symptoms are year-round but seems to be getting worse.  The clarinex does help.  Has not use eye drops or nose sprays.   ? ?No history of asthma or eczema.     ? ?Review of systems: ?Review of Systems  ?Constitutional: Negative.   ?HENT:    ?     See HPI  ?Eyes:   ?     See HPI  ?Respiratory: Negative.    ?Cardiovascular: Negative.   ?Gastrointestinal: Negative.   ?Musculoskeletal: Negative.   ?Skin:   ?     See HPI  ?Allergic/Immunologic: Negative.   ?Neurological: Negative.   ? ?All other systems negative unless noted above in HPI ? ?Past medical history: ?Past Medical History:  ?Diagnosis Date  ? Migraine   ? Urticaria   ? ? ?Past surgical history: ?Past Surgical History:  ?Procedure Laterality Date  ? DENTAL SURGERY    ? ? ?Family history:  ?Family History  ?Problem Relation Age of Onset  ? Hypertension Father   ? Heart Problems Paternal Grandmother   ? Migraines Neg Hx   ? ? ?Social history: ?Lives in an apartment with carpeting with electric  heating and central cooling.  No pets in the home.  No concern for water damage, mildew or roaches in the home.  She is in customer service administration.   ?Tobacco Use  ? Smoking status: Every Day  ?  Packs/day: 0.50 for  ?  Types: Cigarettes  ?  Passive exposure: Never  ? Smokeless tobacco: Never  ?Vaping Use  ? Vaping Use: Never used  ? ? ? ?Medication List: ?Current Outpatient Medications  ?Medication Sig Dispense Refill  ? desloratadine (CLARINEX) 5 MG tablet Take 5 mg by mouth daily.    ? hydrOXYzine (ATARAX) 25 MG tablet Take 12.5-25 mg by mouth 3 (three) times daily as needed.    ? ibuprofen (ADVIL) 800 MG tablet Take by mouth as needed.    ? JUNEL FE 1.5/30 1.5-30 MG-MCG tablet Take 1 tablet by mouth daily.    ? naproxen (NAPROSYN) 500 MG  tablet Take 1 tablet (500 mg total) by mouth 2 (two) times daily. 20 tablet 0  ? nortriptyline (PAMELOR) 10 MG capsule Take 2 capsules (20 mg total) by mouth at bedtime. 60 capsule 6  ? predniSONE (DELTASONE) 20 MG tablet Take 2 tablets (40 mg total) by mouth daily. Take 40 mg by mouth daily for 3 days, then 20mg  by mouth daily for 3 days, then 10mg  daily for 3 days 12 tablet 0  ? Vitamin D, Ergocalciferol, (DRISDOL) 1.25 MG (50000 UNIT) CAPS capsule Take 50,000 Units by mouth once a week.    ? ?No current facility-administered medications for this visit.  ? ? ?Known medication allergies: ?No Known Allergies ? ? ?Physical examination: ?Blood pressure (!) 156/92, pulse 100, temperature 98.1 ?F (36.7 ?C), temperature source Temporal, resp. rate 16, height 5' 6.5" (1.689 m), weight 238 lb 9.6 oz (108.2 kg), SpO2 100 %. ? ?General: Alert, interactive, uncomfortable/scratching. ?HEENT: PERRLA, TMs pearly gray, turbinates moderately edematous without discharge, post-pharynx non erythematous. ?Neck: Supple without lymphadenopathy. ?Lungs: Clear to auscultation without wheezing, rhonchi or rales. {no increased work of breathing. ?CV: Normal S1, S2 without murmurs. ?Abdomen: Nondistended, nontender. ?Skin: Scattered erythematous urticarial type lesions primarily located forarms b/l, hands b/l, upper chest, legs , nonvesicular. ?Extremities:  No clubbing, cyanosis or edema. ?Neuro:   Grossly intact. ? ?Diagnositics/Labs: ? ?Allergy testing: deferred due to ongoing urticaria ? ?Assessment and plan: ?  ?Chronic urticaria and angioedema ?- at this time etiology of hives and swelling is unknown.  Hives can be caused by a variety of different triggers including illness/infection, foods, medications, stings, exercise, pressure, vibrations, extremes of temperature to name a few however majority of the time there is no identifiable trigger.  Your symptoms have been ongoing for >6 weeks making this chronic thus will obtain labwork to  evaluate: CBC w diff, CMP, tryptase, hive panel, environmental panel, alpha-gal panel, inflammatory markers ?- for hive control take following regimen: Clarinex 1 tab twice a day with Pepcid 1 tab twice a day with Singulair 1 tab daily ?- if triple therapy regimen is not effective enough then would start Xolair monthly injections discussed today.  Informational brochure provided today ? ?Rhinoconjunctivitis, presumed allergic ?- Clarinex as above ?- for itchy/watery eyes use Pataday 1 drop each eye daily ?- Ryaltris (olopatadine/mometasone) two sprays per nostril 1-2 times daily as needed.  This is a combination nasal spray with nasal steroid mometasone that helps with congestion control and nasal antihistamine olopatadine that helps with drainage control. ? ?Follow-up in 2 months or sooner if needed ? ?I appreciate the opportunity  to take part in Tacori's care. Please do not hesitate to contact me with questions. ? ?Sincerely, ? ? ?Margo AyeShaylar Tynia Wiers, MD ?Allergy/Immunology ?[facility] ?

## 2021-09-17 NOTE — Patient Instructions (Addendum)
Chronic hive and swelling ?- at this time etiology of hives and swelling is unknown.  Hives can be caused by a variety of different triggers including illness/infection, foods, medications, stings, exercise, pressure, vibrations, extremes of temperature to name a few however majority of the time there is no identifiable trigger.  Your symptoms have been ongoing for >6 weeks making this chronic thus will obtain labwork to evaluate: CBC w diff, CMP, tryptase, hive panel, environmental panel, alpha-gal panel, inflammatory markers ?- for hive control take following regimen: Clarinex 1 tab twice a day with Pepcid 1 tab twice a day with Singulair 1 tab daily ?- if triple therapy regimen is not effective enough then would start Xolair monthly injections discussed today.  Informational brochure provided today ? ?Allergies ?- Clarinex as above ?- for itchy/watery eyes use Pataday 1 drop each eye daily ?- Ryaltris (olopatadine/mometasone) two sprays per nostril 1-2 times daily as needed.  This is a combination nasal spray with nasal steroid mometasone that helps with congestion control and nasal antihistamine olopatadine that helps with drainage control. ? ?Follow-up in 2 months or sooner if needed ?

## 2021-09-26 LAB — IGE NUT PROF. W/COMPONENT RFLX
F017-IgE Hazelnut (Filbert): 0.15 kU/L — AB
F018-IgE Brazil Nut: <0.1 kU/L
F020-IgE Almond: <0.1 kU/L
F202-IgE Cashew Nut: <0.1 kU/L
F203-IgE Pistachio Nut: <0.1 kU/L
F256-IgE Walnut: <0.1 kU/L
Macadamia Nut, IgE: <0.1 kU/L
Peanut, IgE: <0.1 kU/L
Pecan Nut IgE: <0.1 kU/L

## 2021-09-26 LAB — ALLERGENS W/TOTAL IGE AREA 2
Alternaria Alternata IgE: <0.1 kU/L
Aspergillus Fumigatus IgE: <0.1 kU/L
Bermuda Grass IgE: <0.1 kU/L
Cat Dander IgE: <0.1 kU/L
Cedar, Mountain IgE: <0.1 kU/L
Cladosporium Herbarum IgE: <0.1 kU/L
Cockroach, German IgE: <0.1 kU/L
Common Silver Birch IgE: 0.16 kU/L — AB
Cottonwood IgE: <0.1 kU/L
D Farinae IgE: <0.1 kU/L
D Pteronyssinus IgE: <0.1 kU/L
Dog Dander IgE: <0.1 kU/L
Elm, American IgE: <0.1 kU/L
Johnson Grass IgE: <0.1 kU/L
Maple/Box Elder IgE: 0.11 kU/L — AB
Mouse Urine IgE: <0.1 kU/L
Oak, White IgE: 0.24 kU/L — AB
Pecan, Hickory IgE: <0.1 kU/L
Penicillium Chrysogen IgE: <0.1 kU/L
Pigweed, Rough IgE: <0.1 kU/L
Ragweed, Short IgE: <0.1 kU/L
Sheep Sorrel IgE Qn: <0.1 kU/L
Timothy Grass IgE: <0.1 kU/L
White Mulberry IgE: <0.1 kU/L

## 2021-09-26 LAB — COMPREHENSIVE METABOLIC PANEL
ALT: 13 IU/L (ref 0–32)
AST: 16 IU/L (ref 0–40)
Albumin/Globulin Ratio: 1.6 (ref 1.2–2.2)
Albumin: 4.4 g/dL (ref 3.8–4.8)
Alkaline Phosphatase: 70 IU/L (ref 44–121)
BUN/Creatinine Ratio: 10 (ref 9–23)
BUN: 8 mg/dL (ref 6–20)
Bilirubin Total: 0.3 mg/dL (ref 0.0–1.2)
CO2: 19 mmol/L — ABNORMAL LOW (ref 20–29)
Calcium: 9.3 mg/dL (ref 8.7–10.2)
Chloride: 106 mmol/L (ref 96–106)
Creatinine, Ser: 0.8 mg/dL (ref 0.57–1.00)
Globulin, Total: 2.8 g/dL (ref 1.5–4.5)
Glucose: 91 mg/dL (ref 70–99)
Potassium: 3.9 mmol/L (ref 3.5–5.2)
Sodium: 139 mmol/L (ref 134–144)
Total Protein: 7.2 g/dL (ref 6.0–8.5)
eGFR: 97 mL/min/{1.73_m2} (ref >59)

## 2021-09-26 LAB — CBC WITH DIFFERENTIAL
Basophils Absolute: 0 10*3/uL (ref 0.0–0.2)
Basos: 0 %
EOS (ABSOLUTE): 0.1 10*3/uL (ref 0.0–0.4)
Eos: 1 %
Hematocrit: 41 % (ref 34.0–46.6)
Hemoglobin: 14.6 g/dL (ref 11.1–15.9)
Immature Grans (Abs): 0 10*3/uL (ref 0.0–0.1)
Immature Granulocytes: 0 %
Lymphocytes Absolute: 1.8 10*3/uL (ref 0.7–3.1)
Lymphs: 35 %
MCH: 36.2 pg — ABNORMAL HIGH (ref 26.6–33.0)
MCHC: 35.6 g/dL (ref 31.5–35.7)
MCV: 102 fL — ABNORMAL HIGH (ref 79–97)
Monocytes Absolute: 0.3 10*3/uL (ref 0.1–0.9)
Monocytes: 6 %
Neutrophils Absolute: 2.9 10*3/uL (ref 1.4–7.0)
Neutrophils: 58 %
RBC: 4.03 x10E6/uL (ref 3.77–5.28)
RDW: 12.1 % (ref 11.7–15.4)
WBC: 5.1 10*3/uL (ref 3.4–10.8)

## 2021-09-26 LAB — THYROID ANTIBODIES
Thyroglobulin Antibody: <1 IU/mL (ref 0.0–0.9)
Thyroperoxidase Ab SerPl-aCnc: <9 IU/mL (ref 0–34)

## 2021-09-26 LAB — PANEL 604726
Cor A 1 IgE: 0.17 kU/L — AB
Cor A 14 IgE: <0.1 kU/L
Cor A 8 IgE: <0.1 kU/L
Cor A 9 IgE: <0.1 kU/L

## 2021-09-26 LAB — ALPHA-GAL PANEL
Allergen Lamb IgE: <0.1 kU/L
Beef IgE: <0.1 kU/L
IgE (Immunoglobulin E), Serum: 239 IU/mL (ref 6–495)
O215-IgE Alpha-Gal: <0.1 kU/L
Pork IgE: <0.1 kU/L

## 2021-09-26 LAB — CHRONIC URTICARIA: cu index: 1.7 (ref <10)

## 2021-09-26 LAB — ALLERGEN COMPONENT COMMENTS

## 2021-09-26 LAB — TRYPTASE: Tryptase: 8.8 ug/L (ref 2.2–13.2)

## 2021-09-26 LAB — ALLERGEN CHOCOLATE: Chocolate/Cacao IgE: <0.1 kU/L

## 2021-09-26 LAB — SEDIMENTATION RATE: Sed Rate: 34 mm/hr — ABNORMAL HIGH (ref 0–32)

## 2021-10-15 ENCOUNTER — Telehealth: Payer: Self-pay | Admitting: Allergy

## 2021-10-15 NOTE — Telephone Encounter (Signed)
Spoken to patient and notified Dr Randell Patient comments regarding her labs. Verbalized understanding.

## 2021-10-15 NOTE — Telephone Encounter (Signed)
Patient was calling about her labs, said that we have called 3 times.

## 2021-10-23 ENCOUNTER — Ambulatory Visit: Payer: Managed Care, Other (non HMO) | Admitting: Allergy

## 2021-10-23 ENCOUNTER — Encounter: Payer: Self-pay | Admitting: Allergy

## 2021-10-23 VITALS — BP 154/92 | HR 98 | Temp 98.3°F | Resp 16

## 2021-10-23 DIAGNOSIS — H1013 Acute atopic conjunctivitis, bilateral: Secondary | ICD-10-CM

## 2021-10-23 DIAGNOSIS — J301 Allergic rhinitis due to pollen: Secondary | ICD-10-CM | POA: Diagnosis not present

## 2021-10-23 DIAGNOSIS — T783XXD Angioneurotic edema, subsequent encounter: Secondary | ICD-10-CM | POA: Diagnosis not present

## 2021-10-23 DIAGNOSIS — L508 Other urticaria: Secondary | ICD-10-CM | POA: Diagnosis not present

## 2021-10-23 NOTE — Progress Notes (Signed)
Follow-up Note  RE: JERA HEADINGS MRN: [id number] DOB: Dec 14, 1985 Date of Office Visit: 10/23/2021   History of present illness: Jasmine Cunningham is a 36 y.o. female presenting today for follow-up of chronic urticaria with angioedema and rhinoconjunctivitis.  She was last seen in the office on 09/17/2021 by myself.  She has not had any major health changes, surgeries or hospitalizations since last visit  Since this visit she states her hives have been much better controlled.  She has not itching all the time.  She is currently using Allegra daily with Singulair daily.  At this time she feels like the hives are manageable on this current regimen. She has been reporting itchy eyes primarily but at this time.  She does not have Pataday nor does she have the Ryaltris nasal spray.  However she states she has not been having any issues with her nose at this time.  Review of systems: Review of Systems  Constitutional: Negative.   HENT: Negative.    Eyes:  Positive for itching.  Respiratory: Negative.    Cardiovascular: Negative.   Gastrointestinal: Negative.   Musculoskeletal: Negative.   Skin: Negative.   Allergic/Immunologic: Negative.   Neurological: Negative.      All other systems negative unless noted above in HPI  Past medical/social/surgical/family history have been reviewed and are unchanged unless specifically indicated below.  No changes  Medication List: Current Outpatient Medications  Medication Sig Dispense Refill   amLODipine (NORVASC) 10 MG tablet Take 10 mg by mouth daily.     desloratadine (CLARINEX) 5 MG tablet Take 5 mg by mouth daily.     hydrOXYzine (ATARAX) 25 MG tablet Take 12.5-25 mg by mouth 3 (three) times daily as needed.     ibuprofen (ADVIL) 800 MG tablet Take by mouth as needed.     JUNEL FE 1.5/30 1.5-30 MG-MCG tablet Take 1 tablet by mouth daily.     naproxen (NAPROSYN) 500 MG tablet Take 1 tablet (500 mg total) by mouth 2 (two) times daily.  20 tablet 0   nortriptyline (PAMELOR) 10 MG capsule Take 2 capsules (20 mg total) by mouth at bedtime. 60 capsule 6   Vitamin D, Ergocalciferol, (DRISDOL) 1.25 MG (50000 UNIT) CAPS capsule Take 50,000 Units by mouth once a week.     No current facility-administered medications for this visit.     Known medication allergies: No Known Allergies   Physical examination: Blood pressure (!) 154/92, pulse 98, temperature 98.3 F (36.8 C), resp. rate 16, SpO2 98 %.  General: Alert, interactive, in no acute distress. HEENT: PERRLA, TMs pearly gray, turbinates non-edematous without discharge, post-pharynx non erythematous. Neck: Supple without lymphadenopathy. Lungs: Clear to auscultation without wheezing, rhonchi or rales. {no increased work of breathing. CV: Normal S1, S2 without murmurs. Abdomen: Nondistended, nontender. Skin: Warm and dry, without lesions or rashes. Extremities:  No clubbing, cyanosis or edema. Neuro:   Grossly intact.  Diagnositics/Labs: Labs:  Component     Latest Ref Rng 09/17/2021  D Pteronyssinus IgE     Class 0 kU/L <0.10   D Farinae IgE     Class 0 kU/L <0.10   Cat Dander IgE     Class 0 kU/L <0.10   Dog Dander IgE     Class 0 kU/L <0.10   [place] Grass IgE     Class 0 kU/L <0.10   Timothy Grass IgE     Class 0 kU/L <0.10   Johnson Grass IgE  Class 0 kU/L <0.10   Cockroach, German IgE     Class 0 kU/L <0.10   Penicillium Chrysogen IgE     Class 0 kU/L <0.10   Cladosporium Herbarum IgE     Class 0 kU/L <0.10   Aspergillus Fumigatus IgE     Class 0 kU/L <0.10   Alternaria Alternata IgE     Class 0 kU/L <0.10   Maple/Box Elder IgE     Class 0/I kU/L 0.11 !   Common Silver Wendee Copp IgE     Class 0/I kU/L 0.16 !   Walker, [place] IgE     Class 0 kU/L <0.10   Oak, [place] IgE     Class 0/I kU/L 0.24 !   Elm, American IgE     Class 0 kU/L <0.10   [place] IgE     Class 0 kU/L <0.10   Pecan, Hickory IgE     Class 0 kU/L <0.10   White Mulberry  IgE     Class 0 kU/L <0.10   Ragweed, Short IgE     Class 0 kU/L <0.10   Pigweed, Rough IgE     Class 0 kU/L <0.10   Sheep Sorrel IgE Qn     Class 0 kU/L <0.10   Mouse Urine IgE     Class 0 kU/L <0.10   WBC     3.4 - 10.8 x10E3/uL 5.1   RBC     3.77 - 5.28 x10E6/uL 4.03   Hemoglobin     11.1 - 15.9 g/dL 14.6   HCT     34.0 - 46.6 % 41.0   MCV     79 - 97 fL 102 (H)   MCH     26.6 - 33.0 pg 36.2 (H)   MCHC     31.5 - 35.7 g/dL 35.6   RDW     11.7 - 15.4 % 12.1   Neutrophils     Not Estab. % 58   Lymphs     Not Estab. % 35   Monocytes     Not Estab. % 6   Eos     Not Estab. % 1   Basos     Not Estab. % 0   NEUT#     1.4 - 7.0 x10E3/uL 2.9   Lymphocyte #     0.7 - 3.1 x10E3/uL 1.8   Monocytes Absolute     0.1 - 0.9 x10E3/uL 0.3   EOS (ABSOLUTE)     0.0 - 0.4 x10E3/uL 0.1   Basophils Absolute     0.0 - 0.2 x10E3/uL 0.0   Immature Granulocytes     Not Estab. % 0   Immature Grans (Abs)     0.0 - 0.1 x10E3/uL 0.0   Glucose     70 - 99 mg/dL 91   BUN     6 - 20 mg/dL 8   Creatinine     0.57 - 1.00 mg/dL 0.80   eGFR     >59 mL/min/1.73 97   BUN/Creatinine Ratio     9 - 23  10   Sodium     134 - 144 mmol/L 139   Potassium     3.5 - 5.2 mmol/L 3.9   Chloride     96 - 106 mmol/L 106   CO2     20 - 29 mmol/L 19 (L)   Calcium     8.7 - 10.2 mg/dL 9.3   Total Protein  6.0 - 8.5 g/dL 7.2   Albumin     3.8 - 4.8 g/dL 4.4   Globulin, Total     1.5 - 4.5 g/dL 2.8   Albumin/Globulin Ratio     1.2 - 2.2  1.6   Total Bilirubin     0.0 - 1.2 mg/dL 0.3   Alkaline Phosphatase     44 - 121 IU/L 70   AST     0 - 40 IU/L 16   ALT     0 - 32 IU/L 13   Class Description Allergens Comment   F017-IgE Hazelnut (Fil[place])     Class 0/I kU/L 0.15 !   F256-IgE Walnut     Class 0 kU/L <0.10   F202-IgE Cashew Nut     Class 0 kU/L <0.10   F018-IgE [place] Nut     Class 0 kU/L <0.10   Peanut, IgE     Class 0 kU/L <0.10   Macadamia Nut, IgE     Class 0 kU/L  <0.10   Pecan Nut IgE     Class 0 kU/L <0.10   F203-IgE Pistachio Nut     Class 0 kU/L <0.10   F020-IgE Almond     Class 0 kU/L <0.10   IgE (Immunoglobulin E), Serum     6 - 495 IU/mL 239   O215-IgE Alpha-Gal     Class 0 kU/L <0.10   Beef IgE     Class 0 kU/L <0.10   Pork IgE     Class 0 kU/L <0.10   Allergen Lamb IgE     Class 0 kU/L <0.10   Cor A 1 IgE     Class 0/I kU/L 0.17 !   Cor A 8 IgE     Class 0 kU/L <0.10   Cor A 9 IgE     Class 0 kU/L <0.10   Cor A 14 IgE     Class 0 kU/L <0.10   Thyroperoxidase Ab SerPl-aCnc     0 - 34 IU/mL <9   Thyroglobulin Antibody     0.0 - 0.9 IU/mL <1.0   Tryptase     2.2 - 13.2 ug/L 8.8   cu index     <10  1.7   Chocolate/Cacao IgE     Class 0 kU/L <0.10   Sed Rate     0 - 32 mm/hr 34 (H)   Allergen Comments Note      Assessment and plan: Chronic urticaria and angioedema - at this time etiology of hives and swelling is spontaneous in nature.  Hives can be caused by a variety of different triggers including illness/infection, foods, medications, stings, exercise, pressure, vibrations, extremes of temperature to name a few however majority of the time there is no identifiable trigger. - labwork revealed the following:   -Environmental allergy panel shows positive but low IgE to tree pollen -Nut IgE panel shows very low IgE to hazelnut.   Avoid hazelnut products -IgE to chocolate is negative -Inflammatory marker is just very slightly elevated -CBC and CMP are unremarkable -Tryptase level is normal thus does not have "hyperactive" allergy cells -Thyroid antibodies are normal -Chronic hive index is normal thus does not have in form of hives -Alpha-gal panel is negative thus does not have red meat allergy  - for hive control continue the following regimen: Allegra 1 tab once a day with Singulair 1 tab daily - if hives worsen can take Allegra twice a day and add in Pepcid  twice a day as well.   Consider stating monthly Xolair if  hives worsen.    Allergic rhinitis with conjunctivitis - allergen avoidance measures provided for tree pollen - Allegra as above - for itchy/watery eyes use Pataday 1 drop each eye daily  Follow-up in 4-6 months or sooner if needed    I appreciate the opportunity to take part in Mane's care. Please do not hesitate to contact me with questions.  Sincerely,   Prudy Feeler, MD Allergy/Immunology [facility]

## 2021-10-23 NOTE — Patient Instructions (Addendum)
Chronic hive and swelling - at this time etiology of hives and swelling is spontaneous in nature.  Hives can be caused by a variety of different triggers including illness/infection, foods, medications, stings, exercise, pressure, vibrations, extremes of temperature to name a few however majority of the time there is no identifiable trigger. - labwork revealed the following:   -Environmental allergy panel shows positive but low IgE to tree pollen -Nut IgE panel shows very low IgE to hazelnut.   Avoid hazelnut products -IgE to chocolate is negative -Inflammatory marker is just very slightly elevated -CBC and CMP are unremarkable -Tryptase level is normal thus does not have "hyperactive" allergy cells -Thyroid antibodies are normal -Chronic hive index is normal thus does not have in form of hives -Alpha-gal panel is negative thus does not have red meat allergy  - for hive control continue the following regimen: Allegra 1 tab once a day with Singulair 1 tab daily - if hives worsen can take Allegra twice a day and add in Pepcid twice a day as well.   Consider stating monthly Xolair if hives worsen.    Allergies - allergen avoidance measures provided for tree pollen - Allegra as above - for itchy/watery eyes use Pataday 1 drop each eye daily  Follow-up in 4-6 months or sooner if needed

## 2023-11-09 ENCOUNTER — Other Ambulatory Visit: Payer: Self-pay | Admitting: Obstetrics and Gynecology

## 2023-11-09 DIAGNOSIS — Z1231 Encounter for screening mammogram for malignant neoplasm of breast: Secondary | ICD-10-CM

## 2023-11-23 ENCOUNTER — Ambulatory Visit
Admission: RE | Admit: 2023-11-23 | Discharge: 2023-11-23 | Disposition: A | Discharge status: Home / Self Care | Payer: PRIVATE HEALTH INSURANCE | Source: Ambulatory Visit | Attending: Obstetrics and Gynecology | Admitting: Obstetrics and Gynecology

## 2023-11-23 DIAGNOSIS — Z1231 Encounter for screening mammogram for malignant neoplasm of breast: Secondary | ICD-10-CM
